# Patient Record
Sex: Female | Born: 1975 | Race: White | Hispanic: No | Marital: Married | Smoking: Never smoker
Health system: Southern US, Community
[De-identification: ages and names within clinical notes are randomized; demographics above are authoritative.]

## PROBLEM LIST (undated history)

## (undated) DIAGNOSIS — C9590 Leukemia, unspecified not having achieved remission: Secondary | ICD-10-CM

## (undated) DIAGNOSIS — F419 Anxiety disorder, unspecified: Secondary | ICD-10-CM

## (undated) DIAGNOSIS — B009 Herpesviral infection, unspecified: Secondary | ICD-10-CM

## (undated) HISTORY — PX: MOUTH SURGERY: SHX715

---
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
## 2000-11-05 ENCOUNTER — Inpatient Hospital Stay (HOSPITAL_COMMUNITY): Admission: AD | Admit: 2000-11-05 | Discharge: 2000-11-05 | Payer: Self-pay | Admitting: *Deleted

## 2001-07-17 ENCOUNTER — Other Ambulatory Visit: Admission: RE | Admit: 2001-07-17 | Discharge: 2001-07-17 | Payer: Self-pay | Admitting: Gynecology

## 2002-08-06 ENCOUNTER — Other Ambulatory Visit: Admission: RE | Admit: 2002-08-06 | Discharge: 2002-08-06 | Payer: Self-pay | Admitting: Gynecology

## 2003-11-05 ENCOUNTER — Other Ambulatory Visit: Admission: RE | Admit: 2003-11-05 | Discharge: 2003-11-05 | Payer: Self-pay | Admitting: Gynecology

## 2005-02-16 ENCOUNTER — Other Ambulatory Visit: Admission: RE | Admit: 2005-02-16 | Discharge: 2005-02-16 | Payer: Self-pay | Admitting: Gynecology

## 2006-02-18 ENCOUNTER — Other Ambulatory Visit: Admission: RE | Admit: 2006-02-18 | Discharge: 2006-02-18 | Payer: Self-pay | Admitting: Gynecology

## 2009-05-27 ENCOUNTER — Ambulatory Visit (HOSPITAL_COMMUNITY): Admission: RE | Admit: 2009-05-27 | Discharge: 2009-05-27 | Payer: Self-pay | Admitting: Obstetrics and Gynecology

## 2010-02-22 ENCOUNTER — Inpatient Hospital Stay (HOSPITAL_COMMUNITY): Admission: AD | Admit: 2010-02-22 | Discharge: 2010-02-25 | Payer: Self-pay | Admitting: Obstetrics and Gynecology

## 2010-04-01 ENCOUNTER — Ambulatory Visit (HOSPITAL_COMMUNITY)
Admission: RE | Admit: 2010-04-01 | Discharge: 2010-04-01 | Payer: Self-pay | Source: Home / Self Care | Admitting: Obstetrics and Gynecology

## 2010-04-07 ENCOUNTER — Encounter: Payer: Self-pay | Admitting: Cardiovascular Disease

## 2010-04-10 DIAGNOSIS — C9590 Leukemia, unspecified not having achieved remission: Secondary | ICD-10-CM | POA: Insufficient documentation

## 2010-04-10 DIAGNOSIS — R079 Chest pain, unspecified: Secondary | ICD-10-CM | POA: Insufficient documentation

## 2010-04-13 ENCOUNTER — Ambulatory Visit: Payer: Self-pay | Admitting: Cardiovascular Disease

## 2010-04-24 ENCOUNTER — Ambulatory Visit (HOSPITAL_COMMUNITY): Admission: RE | Admit: 2010-04-24 | Discharge: 2010-04-24 | Payer: Self-pay | Admitting: Cardiovascular Disease

## 2010-04-24 ENCOUNTER — Encounter: Payer: Self-pay | Admitting: Cardiovascular Disease

## 2010-04-24 ENCOUNTER — Ambulatory Visit: Payer: Self-pay

## 2010-04-24 ENCOUNTER — Ambulatory Visit: Payer: Self-pay | Admitting: Cardiology

## 2010-04-29 ENCOUNTER — Telehealth: Payer: Self-pay | Admitting: Cardiovascular Disease

## 2010-05-12 ENCOUNTER — Ambulatory Visit: Payer: Self-pay

## 2010-05-12 ENCOUNTER — Ambulatory Visit: Payer: Self-pay | Admitting: Cardiovascular Disease

## 2010-06-10 ENCOUNTER — Emergency Department (HOSPITAL_BASED_OUTPATIENT_CLINIC_OR_DEPARTMENT_OTHER): Admission: EM | Admit: 2010-06-10 | Discharge: 2010-06-10 | Payer: Self-pay | Admitting: Emergency Medicine

## 2010-12-01 NOTE — Assessment & Plan Note (Signed)
Summary: np6/chest pain/hx of leukemia/ post partum/jml   Visit Type:  new pt visit Referring Provider:  Dr. Billy Coast Primary Provider:  Olivia Mackie, MD (OB-Gyn)  CC:  pt states she just had a baby 7 weeks ago...cp possible question of anxiety/panic attack...pt also had leukemia.  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 She describes chest pain in the past with anxiety attacks. The pain is center of her chest and occasionally under her breasts. This is a dull pain and it radiates through to the back and occasionally into the left arm. The pain lasts for several minutes up to five time per day. Sometimes the pain lasts for hours. She does get SOB when she has a panic attack. She denies episodes of palpitations although she can feel it racing when she is having a panic attack. She exercises regularly and has no exertional chest pain. She is 7 weeks post-partum. Her son is healthy. She did have HTN during pregnancy but no complications with delivery.  Gyn Current Medications (verified): 1)  Zoloft 100 Mg Tabs (Sertraline Hcl) .Marland Kitchen.. 1  1/2 Tab Once Daily 2)  Klonopin 0.5 Mg Tabs (Clonazepam) .Marland Kitchen.. 1 Tab Two Times A Day 3)  Prenatal Vitamins 0.8 Mg Tabs (Prenatal Multivit-Min-Fe-Fa) .Marland Kitchen.. 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  The patient denies fatigue, malaise, fever, weight gain/loss, vision  loss, decreased hearing, hoarseness, palpitations, shortness of breath, prolonged cough, wheezing, sleep apnea, coughing up blood, abdominal pain, blood in stool, nausea, vomiting, diarrhea, heartburn, incontinence, blood in urine, muscle weakness, joint pain, leg swelling, rash, skin lesions, headache, fainting, dizziness, depression, anxiety, enlarged lymph nodes, easy bruising or bleeding, and environmental allergies.    Vital Signs:  Patient profile:   35 year old female Height:      67 inches Weight:      152 pounds BMI:     23.89 Pulse rate:   66 / minute Pulse rhythm:   regular BP sitting:   100 / 70  (left arm) Cuff size:   regular  Vitals Entered By: Danielle Rankin, CMA (April 13, 2010 3:13 PM)  Physical Exam  General:  General: Well developed, well nourished, NAD HEENT: OP clear, mucus membranes moist SKIN: warm, dry Neuro: No focal deficits Musculoskeletal: Muscle strength 5/5 all ext Psychiatric: Mood and affect normal Neck: No JVD, no carotid bruits, no thyromegaly, no lymphadenopathy. Lungs:Clear bilaterally, no wheezes, rhonci, crackles CV: RRR no murmurs, gallops rubs Abdomen: soft, NT, ND, BS present Extremities: No edema, pulses 2+.    EKG  Procedure date:  04/13/2010  Findings:      NSR, rate 66 bpm.   Impression & Recommendations:  Problem # 1:  CHEST PAIN (ICD-786.50) Most likely non-cardiac but given her history of chemotherapy and family history of CAD, will arrange exercise stress test to assess for ischemia. I will also arrange an echocardiogram to rule out structural heart disease. These episodes are associated with  anxiety. Will see aortic root on the echo. Her chest pain does not sound like an aortic dissection. Has been occurring on/off for many years. No signs of pulmonary embolus.   Orders: EKG w/ Interpretation (93000) Echocardiogram (Echo) Treadmill (Treadmill)  Patient Instructions: 1)  Your physician recommends that you schedule a  follow-up appointment in: 3 weeks 2)  Your physician has requested that you have an echocardiogram.  Echocardiography is a painless test that uses sound waves to create images of your heart. It provides your doctor with information about the size and shape of your heart and how well your heart's chambers and valves are working.  This procedure takes approximately one hour. There are no restrictions for this procedure. 3)  Your physician has requested that you have an exercise tolerance test.  For further information please visit [email or web address]  Please also follow instruction sheet, as given.

## 2010-12-01 NOTE — Progress Notes (Signed)
Summary: returning call  Phone Note Call from Patient Call back at Home Phone [phone]   Caller: Patient Reason for Call: Talk to Nurse Summary of Call: returning call Initial call taken by: Migdalia Dk,  April 29, 2010 12:00 PM  Follow-up for Phone Call        left message to  call back Dossie Arbour, RN, BSN  April 29, 2010 1:14 PM' I had called pt regarding echo results.  Dr. Clifton James reviewed echo with pt at treadmill office visit on May 12, 2010 Follow-up by: Dossie Arbour, RN, BSN,  May 12, 2010 5:49 PM

## 2010-12-01 NOTE — Letter (Signed)
Summary: [facility] Office Visit Note   [facility] Office Visit Note   Imported By: Roderic Ovens 04/30/2010 10:14:29  _____________________________________________________________________  External Attachment:    Type:   Image     Comment:   External Document

## 2010-12-01 NOTE — Consult Note (Signed)
Summary: [facility] Patient Referral Form   [facility] Patient Referral Form   Imported By: Roderic Ovens 04/30/2010 10:15:31  _____________________________________________________________________  External Attachment:    Type:   Image     Comment:   External Document

## 2011-01-15 LAB — PREGNANCY, URINE: Preg Test, Ur: NEGATIVE

## 2011-01-19 LAB — COMPREHENSIVE METABOLIC PANEL
ALT: 19 U/L (ref 0–35)
ALT: 19 U/L (ref 0–35)
AST: 24 U/L (ref 0–37)
AST: 28 U/L (ref 0–37)
Albumin: 2.7 g/dL — ABNORMAL LOW (ref 3.5–5.2)
Albumin: 2.9 g/dL — ABNORMAL LOW (ref 3.5–5.2)
Alkaline Phosphatase: 111 U/L (ref 39–117)
Alkaline Phosphatase: 124 U/L — ABNORMAL HIGH (ref 39–117)
BUN: 12 mg/dL (ref 6–23)
BUN: 14 mg/dL (ref 6–23)
CO2: 22 mEq/L (ref 19–32)
CO2: 23 mEq/L (ref 19–32)
Calcium: 8.2 mg/dL — ABNORMAL LOW (ref 8.4–10.5)
Calcium: 9.3 mg/dL (ref 8.4–10.5)
Chloride: 105 mEq/L (ref 96–112)
Chloride: 99 mEq/L (ref 96–112)
Creatinine, Ser: 0.58 mg/dL (ref 0.4–1.2)
Creatinine, Ser: 0.66 mg/dL (ref 0.4–1.2)
GFR calc Af Amer: >60 mL/min (ref >60)
GFR calc Af Amer: >60 mL/min (ref >60)
GFR calc non Af Amer: >60 mL/min (ref >60)
GFR calc non Af Amer: >60 mL/min (ref >60)
Glucose, Bld: 78 mg/dL (ref 70–99)
Glucose, Bld: 92 mg/dL (ref 70–99)
Potassium: 3.9 mEq/L (ref 3.5–5.1)
Potassium: 3.9 mEq/L (ref 3.5–5.1)
Sodium: 129 mEq/L — ABNORMAL LOW (ref 135–145)
Sodium: 135 mEq/L (ref 135–145)
Total Bilirubin: 0.2 mg/dL — ABNORMAL LOW (ref 0.3–1.2)
Total Bilirubin: 0.6 mg/dL (ref 0.3–1.2)
Total Protein: 5.5 g/dL — ABNORMAL LOW (ref 6.0–8.3)
Total Protein: 5.7 g/dL — ABNORMAL LOW (ref 6.0–8.3)

## 2011-01-19 LAB — CBC
HCT: 33.8 % — ABNORMAL LOW (ref 36.0–46.0)
HCT: 34.5 % — ABNORMAL LOW (ref 36.0–46.0)
HCT: 34.8 % — ABNORMAL LOW (ref 36.0–46.0)
HCT: 35 % — ABNORMAL LOW (ref 36.0–46.0)
Hemoglobin: 11.6 g/dL — ABNORMAL LOW (ref 12.0–15.0)
Hemoglobin: 11.8 g/dL — ABNORMAL LOW (ref 12.0–15.0)
Hemoglobin: 11.8 g/dL — ABNORMAL LOW (ref 12.0–15.0)
Hemoglobin: 11.9 g/dL — ABNORMAL LOW (ref 12.0–15.0)
MCHC: 33.7 g/dL (ref 30.0–36.0)
MCHC: 34.2 g/dL (ref 30.0–36.0)
MCHC: 34.3 g/dL (ref 30.0–36.0)
MCHC: 34.4 g/dL (ref 30.0–36.0)
MCV: 95.1 fL (ref 78.0–100.0)
MCV: 95.2 fL (ref 78.0–100.0)
MCV: 95.2 fL (ref 78.0–100.0)
MCV: 95.6 fL (ref 78.0–100.0)
Platelets: 163 10*3/uL (ref 150–400)
Platelets: 163 10*3/uL (ref 150–400)
Platelets: 176 10*3/uL (ref 150–400)
Platelets: 187 10*3/uL (ref 150–400)
RBC: 3.55 MIL/uL — ABNORMAL LOW (ref 3.87–5.11)
RBC: 3.62 MIL/uL — ABNORMAL LOW (ref 3.87–5.11)
RBC: 3.66 MIL/uL — ABNORMAL LOW (ref 3.87–5.11)
RBC: 3.66 MIL/uL — ABNORMAL LOW (ref 3.87–5.11)
RDW: 13.5 % (ref 11.5–15.5)
RDW: 13.8 % (ref 11.5–15.5)
RDW: 13.9 % (ref 11.5–15.5)
RDW: 14 % (ref 11.5–15.5)
WBC: 13.4 10*3/uL — ABNORMAL HIGH (ref 4.0–10.5)
WBC: 13.8 10*3/uL — ABNORMAL HIGH (ref 4.0–10.5)
WBC: 18 10*3/uL — ABNORMAL HIGH (ref 4.0–10.5)
WBC: 9.4 10*3/uL (ref 4.0–10.5)

## 2011-01-19 LAB — URIC ACID
Uric Acid, Serum: 4.3 mg/dL (ref 2.4–7.0)
Uric Acid, Serum: 4.6 mg/dL (ref 2.4–7.0)

## 2011-01-19 LAB — LACTATE DEHYDROGENASE
LDH: 144 U/L (ref 94–250)
LDH: 177 U/L (ref 94–250)

## 2011-01-19 LAB — RPR: RPR Ser Ql: NONREACTIVE

## 2012-06-21 ENCOUNTER — Other Ambulatory Visit: Payer: Self-pay | Admitting: Family Medicine

## 2012-06-21 DIAGNOSIS — R200 Anesthesia of skin: Secondary | ICD-10-CM

## 2012-06-21 DIAGNOSIS — R51 Headache: Secondary | ICD-10-CM

## 2012-06-21 DIAGNOSIS — R202 Paresthesia of skin: Secondary | ICD-10-CM

## 2012-06-22 ENCOUNTER — Ambulatory Visit
Admission: RE | Admit: 2012-06-22 | Discharge: 2012-06-22 | Disposition: A | Discharge status: Home / Self Care | Payer: BC Managed Care – PPO | Source: Ambulatory Visit | Attending: Family Medicine | Admitting: Family Medicine

## 2012-06-22 DIAGNOSIS — R200 Anesthesia of skin: Secondary | ICD-10-CM

## 2012-06-22 DIAGNOSIS — R51 Headache: Secondary | ICD-10-CM

## 2012-06-22 DIAGNOSIS — R202 Paresthesia of skin: Secondary | ICD-10-CM

## 2012-06-22 MED ORDER — IOHEXOL 300 MG/ML  SOLN
75.0000 mL | Freq: Once | INTRAMUSCULAR | Status: AC | PRN
  Administered 2012-06-22: 75 mL via INTRAVENOUS

## 2012-09-19 ENCOUNTER — Other Ambulatory Visit: Payer: Self-pay | Admitting: Family Medicine

## 2012-09-19 ENCOUNTER — Ambulatory Visit
Admission: RE | Admit: 2012-09-19 | Discharge: 2012-09-19 | Disposition: A | Discharge status: Home / Self Care | Payer: BC Managed Care – PPO | Source: Ambulatory Visit | Attending: Family Medicine | Admitting: Family Medicine

## 2012-09-19 DIAGNOSIS — R109 Unspecified abdominal pain: Secondary | ICD-10-CM

## 2012-09-20 LAB — OB RESULTS CONSOLE GC/CHLAMYDIA
Chlamydia: NEGATIVE
Gonorrhea: NEGATIVE

## 2012-09-29 LAB — OB RESULTS CONSOLE HIV ANTIBODY (ROUTINE TESTING)
HIV: NONREACTIVE
HIV: NONREACTIVE
HIV: NONREACTIVE
HIV: NONREACTIVE

## 2012-09-29 LAB — OB RESULTS CONSOLE GC/CHLAMYDIA
Chlamydia: NEGATIVE
Chlamydia: NEGATIVE
Gonorrhea: NEGATIVE
Gonorrhea: NEGATIVE

## 2012-09-29 LAB — OB RESULTS CONSOLE ABO/RH: RH Type: POSITIVE

## 2012-09-29 LAB — OB RESULTS CONSOLE RPR
RPR: NONREACTIVE
RPR: NONREACTIVE

## 2012-09-29 LAB — OB RESULTS CONSOLE HEPATITIS B SURFACE ANTIGEN: Hepatitis B Surface Ag: NEGATIVE

## 2012-09-29 LAB — OB RESULTS CONSOLE RUBELLA ANTIBODY, IGM: Rubella: IMMUNE

## 2012-09-29 LAB — OB RESULTS CONSOLE ANTIBODY SCREEN: Antibody Screen: NEGATIVE

## 2012-11-01 NOTE — L&D Delivery Note (Signed)
Delivery Note At 6:38 PM a viable and healthy female was delivered via Vaginal, Spontaneous Delivery (Presentation: Right Occiput Anterior).  APGAR: 8, 9; weight pending. Placenta status: Intact, Spontaneous.  Cord: 3 vessels with the following complications: None.  Cord pH: na  Anesthesia: Epidural  Episiotomy: None Lacerations: 2nd degree Suture Repair: 2.0 vicryl rapide Est. Blood Loss (mL): 200  Mom to postpartum.  Baby to nursery-stable.  Nikash Mortensen J 04/11/2013, 6:56 PM

## 2013-03-08 LAB — OB RESULTS CONSOLE GBS
GBS: NEGATIVE
GBS: NEGATIVE

## 2013-03-09 ENCOUNTER — Inpatient Hospital Stay (HOSPITAL_COMMUNITY): Admit: 2013-03-09 | Payer: BC Managed Care – PPO | Admitting: Obstetrics and Gynecology

## 2013-03-28 LAB — OB RESULTS CONSOLE GBS: GBS: NEGATIVE

## 2013-04-10 ENCOUNTER — Inpatient Hospital Stay (HOSPITAL_COMMUNITY)
Admission: RE | Admit: 2013-04-10 | Discharge: 2013-04-13 | DRG: 372 | Disposition: A | Discharge status: Home / Self Care | Payer: BC Managed Care – PPO | Source: Ambulatory Visit | Attending: Obstetrics & Gynecology | Admitting: Obstetrics & Gynecology

## 2013-04-10 ENCOUNTER — Other Ambulatory Visit: Payer: Self-pay | Admitting: Obstetrics and Gynecology

## 2013-04-10 ENCOUNTER — Encounter (HOSPITAL_COMMUNITY): Payer: Self-pay

## 2013-04-10 DIAGNOSIS — O9903 Anemia complicating the puerperium: Secondary | ICD-10-CM | POA: Diagnosis not present

## 2013-04-10 DIAGNOSIS — O09529 Supervision of elderly multigravida, unspecified trimester: Secondary | ICD-10-CM | POA: Diagnosis present

## 2013-04-10 DIAGNOSIS — O139 Gestational [pregnancy-induced] hypertension without significant proteinuria, unspecified trimester: Principal | ICD-10-CM | POA: Diagnosis present

## 2013-04-10 DIAGNOSIS — D62 Acute posthemorrhagic anemia: Secondary | ICD-10-CM | POA: Diagnosis not present

## 2013-04-10 HISTORY — DX: Herpesviral infection, unspecified: B00.9

## 2013-04-10 HISTORY — DX: Leukemia, unspecified not having achieved remission: C95.90

## 2013-04-10 HISTORY — DX: Anxiety disorder, unspecified: F41.9

## 2013-04-10 LAB — CBC
HCT: 34 % — ABNORMAL LOW (ref 36.0–46.0)
Hemoglobin: 11.5 g/dL — ABNORMAL LOW (ref 12.0–15.0)
MCH: 31.8 pg (ref 26.0–34.0)
MCHC: 33.8 g/dL (ref 30.0–36.0)
MCV: 93.9 fL (ref 78.0–100.0)
Platelets: 165 10*3/uL (ref 150–400)
RBC: 3.62 MIL/uL — ABNORMAL LOW (ref 3.87–5.11)
RDW: 13.5 % (ref 11.5–15.5)
WBC: 9.1 10*3/uL (ref 4.0–10.5)

## 2013-04-10 MED ORDER — LACTATED RINGERS IV SOLN
INTRAVENOUS | Status: DC
  Administered 2013-04-10 – 2013-04-11 (×3): via INTRAVENOUS

## 2013-04-10 MED ORDER — FLEET ENEMA 7-19 GM/118ML RE ENEM: 1.0000 | ENEMA | RECTAL | Status: DC | PRN

## 2013-04-10 MED ORDER — CITRIC ACID-SODIUM CITRATE 334-500 MG/5ML PO SOLN
30.0000 mL | ORAL | Status: DC | PRN
  Filled 2013-04-10: qty 15

## 2013-04-10 MED ORDER — OXYTOCIN 40 UNITS IN LACTATED RINGERS INFUSION - SIMPLE MED
1.0000 m[IU]/min | INTRAVENOUS | Status: DC
  Administered 2013-04-11: 2 m[IU]/min via INTRAVENOUS
  Filled 2013-04-10: qty 1000

## 2013-04-10 MED ORDER — OXYTOCIN 40 UNITS IN LACTATED RINGERS INFUSION - SIMPLE MED: 62.5000 mL/h | INTRAVENOUS | Status: DC

## 2013-04-10 MED ORDER — OXYCODONE-ACETAMINOPHEN 5-325 MG PO TABS: 1.0000 | ORAL_TABLET | ORAL | Status: DC | PRN

## 2013-04-10 MED ORDER — LACTATED RINGERS IV SOLN: 500.0000 mL | INTRAVENOUS | Status: DC | PRN

## 2013-04-10 MED ORDER — BUTORPHANOL TARTRATE 1 MG/ML IJ SOLN: 1.0000 mg | INTRAMUSCULAR | Status: DC | PRN

## 2013-04-10 MED ORDER — IBUPROFEN 600 MG PO TABS: 600.0000 mg | ORAL_TABLET | Freq: Four times a day (QID) | ORAL | Status: DC | PRN

## 2013-04-10 MED ORDER — ONDANSETRON HCL 4 MG/2ML IJ SOLN: 4.0000 mg | Freq: Four times a day (QID) | INTRAMUSCULAR | Status: DC | PRN

## 2013-04-10 MED ORDER — ZOLPIDEM TARTRATE 5 MG PO TABS: 5.0000 mg | ORAL_TABLET | Freq: Every evening | ORAL | Status: DC | PRN

## 2013-04-10 MED ORDER — TERBUTALINE SULFATE 1 MG/ML IJ SOLN
0.2500 mg | Freq: Once | INTRAMUSCULAR | Status: AC | PRN
  Filled 2013-04-10: qty 1

## 2013-04-10 MED ORDER — MISOPROSTOL 25 MCG QUARTER TABLET
25.0000 ug | ORAL_TABLET | ORAL | Status: DC | PRN
  Administered 2013-04-10: 25 ug via VAGINAL
  Filled 2013-04-10: qty 0.25
  Filled 2013-04-10: qty 1
  Filled 2013-04-10: qty 0.25

## 2013-04-10 MED ORDER — OXYTOCIN BOLUS FROM INFUSION: 500.0000 mL | INTRAVENOUS | Status: DC

## 2013-04-10 MED ORDER — LIDOCAINE HCL (PF) 1 % IJ SOLN
30.0000 mL | INTRAMUSCULAR | Status: DC | PRN
  Filled 2013-04-10: qty 30

## 2013-04-10 MED ORDER — ACETAMINOPHEN 325 MG PO TABS: 650.0000 mg | ORAL_TABLET | ORAL | Status: DC | PRN

## 2013-04-10 NOTE — Progress Notes (Signed)
Autumn Fuller is a 37 y.o. G3P0011 at [redacted]w[redacted]d by LMP admitted for induction of labor due to Hypertension.  Subjective: Comfortable. No headache or epigastric pain  Objective: BP 148/88  Pulse 86  Temp(Src) 98.6 F (37 C) (Oral)  Resp 18  Ht 5\' 7"  (1.702 m)  Wt 75.751 kg (167 lb)  BMI 26.15 kg/m2  LMP 07/06/2012      FHT:  FHR: 125 bpm, variability: moderate,  accelerations:  Present,  decelerations:  Absent UC:   irregular, every 10 minutes SVE:   Dilation: 1 Effacement (%): 30 Station: -3 Exam by:: L. Dupell, RN  Labs: Lab Results  Component Value Date   WBC 9.1 04/10/2013   HGB 11.5* 04/10/2013   HCT 34.0* 04/10/2013   MCV 93.9 04/10/2013   PLT 165 04/10/2013    Assessment / Plan: 39+ weeks Gestational HTN- no s/s PEC  Labor: Progressing normally- cytotec placed Preeclampsia:  no signs or symptoms of toxicity, intake and ouput balanced and labs stable Fetal Wellbeing:  Category I Pain Control:  Labor support without medications I/D:  n/a Anticipated MOD:  NSVD  Autumn Fuller 04/10/2013, 10:59 PM

## 2013-04-11 ENCOUNTER — Inpatient Hospital Stay (HOSPITAL_COMMUNITY): Payer: BC Managed Care – PPO

## 2013-04-11 ENCOUNTER — Encounter (HOSPITAL_COMMUNITY): Payer: Self-pay | Admitting: Anesthesiology

## 2013-04-11 ENCOUNTER — Encounter (HOSPITAL_COMMUNITY): Payer: Self-pay

## 2013-04-11 ENCOUNTER — Inpatient Hospital Stay (HOSPITAL_COMMUNITY): Payer: BC Managed Care – PPO | Admitting: Anesthesiology

## 2013-04-11 DIAGNOSIS — O139 Gestational [pregnancy-induced] hypertension without significant proteinuria, unspecified trimester: Secondary | ICD-10-CM | POA: Diagnosis present

## 2013-04-11 LAB — CBC
HCT: 35.2 % — ABNORMAL LOW (ref 36.0–46.0)
HCT: 30.8 % — ABNORMAL LOW (ref 36.0–46.0)
Hemoglobin: 11.9 g/dL — ABNORMAL LOW (ref 12.0–15.0)
Hemoglobin: 10.3 g/dL — ABNORMAL LOW (ref 12.0–15.0)
MCH: 31.6 pg (ref 26.0–34.0)
MCH: 31.2 pg (ref 26.0–34.0)
MCHC: 33.8 g/dL (ref 30.0–36.0)
MCHC: 33.4 g/dL (ref 30.0–36.0)
MCV: 93.4 fL (ref 78.0–100.0)
MCV: 93.3 fL (ref 78.0–100.0)
Platelets: 164 10*3/uL (ref 150–400)
Platelets: 147 10*3/uL — ABNORMAL LOW (ref 150–400)
RBC: 3.77 MIL/uL — ABNORMAL LOW (ref 3.87–5.11)
RBC: 3.3 MIL/uL — ABNORMAL LOW (ref 3.87–5.11)
RDW: 13.9 % (ref 11.5–15.5)
RDW: 13.9 % (ref 11.5–15.5)
WBC: 10.7 10*3/uL — ABNORMAL HIGH (ref 4.0–10.5)
WBC: 13.5 10*3/uL — ABNORMAL HIGH (ref 4.0–10.5)

## 2013-04-11 LAB — RPR: RPR Ser Ql: NONREACTIVE

## 2013-04-11 MED ORDER — IBUPROFEN 600 MG PO TABS
600.0000 mg | ORAL_TABLET | Freq: Four times a day (QID) | ORAL | Status: DC
  Administered 2013-04-11 – 2013-04-13 (×6): 600 mg via ORAL
  Filled 2013-04-11 (×6): qty 1

## 2013-04-11 MED ORDER — BENZOCAINE-MENTHOL 20-0.5 % EX AERO
1.0000 "application " | INHALATION_SPRAY | CUTANEOUS | Status: DC | PRN
  Administered 2013-04-12: 1 via TOPICAL
  Filled 2013-04-11: qty 56

## 2013-04-11 MED ORDER — DIPHENHYDRAMINE HCL 25 MG PO CAPS: 25.0000 mg | ORAL_CAPSULE | Freq: Four times a day (QID) | ORAL | Status: DC | PRN

## 2013-04-11 MED ORDER — DIBUCAINE 1 % RE OINT: 1.0000 "application " | TOPICAL_OINTMENT | RECTAL | Status: DC | PRN

## 2013-04-11 MED ORDER — LACTATED RINGERS IV SOLN: 500.0000 mL | Freq: Once | INTRAVENOUS | Status: DC

## 2013-04-11 MED ORDER — PRENATAL MULTIVITAMIN CH: 1.0000 | ORAL_TABLET | Freq: Every day | ORAL | Status: DC

## 2013-04-11 MED ORDER — LANOLIN HYDROUS EX OINT: TOPICAL_OINTMENT | CUTANEOUS | Status: DC | PRN

## 2013-04-11 MED ORDER — EPHEDRINE 5 MG/ML INJ
10.0000 mg | INTRAVENOUS | Status: DC | PRN
  Filled 2013-04-11: qty 2

## 2013-04-11 MED ORDER — EPHEDRINE 5 MG/ML INJ
10.0000 mg | INTRAVENOUS | Status: DC | PRN
  Filled 2013-04-11: qty 4
  Filled 2013-04-11: qty 2

## 2013-04-11 MED ORDER — TETANUS-DIPHTH-ACELL PERTUSSIS 5-2.5-18.5 LF-MCG/0.5 IM SUSP: 0.5000 mL | Freq: Once | INTRAMUSCULAR | Status: DC

## 2013-04-11 MED ORDER — SERTRALINE HCL 100 MG PO TABS
100.0000 mg | ORAL_TABLET | Freq: Every day | ORAL | Status: DC
  Administered 2013-04-12 – 2013-04-13 (×2): 100 mg via ORAL
  Filled 2013-04-11 (×2): qty 1

## 2013-04-11 MED ORDER — ONDANSETRON HCL 4 MG PO TABS: 4.0000 mg | ORAL_TABLET | ORAL | Status: DC | PRN

## 2013-04-11 MED ORDER — ZOLPIDEM TARTRATE 5 MG PO TABS: 5.0000 mg | ORAL_TABLET | Freq: Every evening | ORAL | Status: DC | PRN

## 2013-04-11 MED ORDER — DIPHENHYDRAMINE HCL 50 MG/ML IJ SOLN: 12.5000 mg | INTRAMUSCULAR | Status: DC | PRN

## 2013-04-11 MED ORDER — ONDANSETRON HCL 4 MG/2ML IJ SOLN: 4.0000 mg | INTRAMUSCULAR | Status: DC | PRN

## 2013-04-11 MED ORDER — METHYLERGONOVINE MALEATE 0.2 MG/ML IJ SOLN: 0.2000 mg | INTRAMUSCULAR | Status: DC | PRN

## 2013-04-11 MED ORDER — METHYLERGONOVINE MALEATE 0.2 MG PO TABS: 0.2000 mg | ORAL_TABLET | ORAL | Status: DC | PRN

## 2013-04-11 MED ORDER — OXYCODONE-ACETAMINOPHEN 5-325 MG PO TABS: 1.0000 | ORAL_TABLET | ORAL | Status: DC | PRN

## 2013-04-11 MED ORDER — WITCH HAZEL-GLYCERIN EX PADS: 1.0000 "application " | MEDICATED_PAD | CUTANEOUS | Status: DC | PRN

## 2013-04-11 MED ORDER — SENNOSIDES-DOCUSATE SODIUM 8.6-50 MG PO TABS
2.0000 | ORAL_TABLET | Freq: Every day | ORAL | Status: DC
  Administered 2013-04-11 – 2013-04-12 (×2): 2 via ORAL

## 2013-04-11 MED ORDER — SIMETHICONE 80 MG PO CHEW: 80.0000 mg | CHEWABLE_TABLET | ORAL | Status: DC | PRN

## 2013-04-11 MED ORDER — PHENYLEPHRINE 40 MCG/ML (10ML) SYRINGE FOR IV PUSH (FOR BLOOD PRESSURE SUPPORT)
80.0000 ug | PREFILLED_SYRINGE | INTRAVENOUS | Status: DC | PRN
  Filled 2013-04-11: qty 2

## 2013-04-11 MED ORDER — LIDOCAINE HCL (PF) 1 % IJ SOLN
INTRAMUSCULAR | Status: DC | PRN
  Administered 2013-04-11 (×2): 5 mL

## 2013-04-11 MED ORDER — PHENYLEPHRINE 40 MCG/ML (10ML) SYRINGE FOR IV PUSH (FOR BLOOD PRESSURE SUPPORT)
80.0000 ug | PREFILLED_SYRINGE | INTRAVENOUS | Status: DC | PRN
  Filled 2013-04-11: qty 2
  Filled 2013-04-11: qty 5

## 2013-04-11 MED ORDER — FENTANYL 2.5 MCG/ML BUPIVACAINE 1/10 % EPIDURAL INFUSION (WH - ANES)
14.0000 mL/h | INTRAMUSCULAR | Status: DC | PRN
  Administered 2013-04-11: 14 mL/h via EPIDURAL
  Filled 2013-04-11: qty 125

## 2013-04-11 NOTE — Progress Notes (Signed)
Patient sitting up to dangle legs, became pale and weak, near syncopal.  RN helped patient lay back in bed flat.  Color returned to lips and face, patient feeling better.  Transported to MBE 120 via stretcher.

## 2013-04-11 NOTE — Anesthesia Procedure Notes (Signed)
Epidural Patient location during procedure: OB Start time: 04/11/2013 3:04 PM  Staffing Anesthesiologist: Angus Seller., Harrell Gave. Performed by: anesthesiologist   Preanesthetic Checklist Completed: patient identified, site marked, surgical consent, pre-op evaluation, timeout performed, IV checked, risks and benefits discussed and monitors and equipment checked  Epidural Patient position: sitting Prep: site prepped and draped and DuraPrep Patient monitoring: continuous pulse ox and blood pressure Approach: midline Injection technique: LOR air and LOR saline  Needle:  Needle type: Tuohy  Needle gauge: 17 G Needle length: 9 cm and 9 Needle insertion depth: 5 cm cm Catheter type: closed end flexible Catheter size: 19 Gauge Catheter at skin depth: 10 cm Test dose: negative  Assessment Events: blood not aspirated, injection not painful, no injection resistance, negative IV test and no paresthesia  Additional Notes Patient identified.  Risk benefits discussed including failed block, incomplete pain control, headache, nerve damage, paralysis, blood pressure changes, nausea, vomiting, reactions to medication both toxic or allergic, and postpartum back pain.  Patient expressed understanding and wished to proceed.  All questions were answered.  Sterile technique used throughout procedure and epidural site dressed with sterile barrier dressing. No paresthesia or other complications noted.The patient did not experience any signs of intravascular injection such as tinnitus or metallic taste in mouth nor signs of intrathecal spread such as rapid motor block. Please see nursing notes for vital signs.

## 2013-04-11 NOTE — H&P (Cosign Needed)
NAMEILAISAANE, MARTS              ACCOUNT NO.:  [id number]  MEDICAL RECORD NO.:  [id number]  LOCATION:  9161                          FACILITY:  [facility]  PHYSICIAN:  Lenoard Aden, M.D.DATE OF BIRTH:  [birth year]  DATE OF ADMISSION:  04/10/2013 DATE OF DISCHARGE:                             HISTORY & PHYSICAL   CHIEF COMPLAINT:  Gestational hypertension at 65 and 5/7th weeks for induction.  HISTORY OF PRESENT ILLNESS:  She is a 37 year old white female, G3, P1, who presents with gestational hypertension, no signs and symptoms of preeclampsia for induction.  MEDICATIONS:  Include prenatal vitamins, Zoloft, Valtrex, and BuSpar as needed.  FAMILY HISTORY:  Migraine headaches, heart disease, ovarian cancer, and depression.  PREGNANCY HISTORY:  One vaginal delivery in 2011 complicated by gestational hypertension, missed AB with medical termination.  SOCIAL HISTORY:  Remarkable only for HSG.  PERSONAL HISTORY:  Depression, anxiety and HSV.  She is a nonsmoker and nondrinker.  She denies domestic or physical violence physical. Prenatal course was complicated by gestational hypertension with normal labs and history of malpresentation.  PHYSICAL EXAMINATION:  GENERAL:  Well-developed, well-nourished, white female in no acute distress. HEENT:  Normal. NECK:  Supple.  Full range of motion. LUNGS:  Clear. HEART:  Regular rate and rhythm. ABDOMEN:  Soft, gravid, nontender.  No right upper quadrant tenderness. Cervix is 1, 60%, vertex, -3. EXTREMITIES:  There are no cords. NEUROLOGIC:  Nonfocal. SKIN:  Intact.  Labs are pending.  NST is reactive.  Cytotec was placed.  IMPRESSION:  Gestational hypertension at term.  PLAN:  Proceed with Cytotec induction.  Anticipate attempts at vaginal delivery.  Monitor signs and symptoms of preeclampsia.     Lenoard Aden, M.D.     RJT/MEDQ  D:  04/10/2013  T:  04/11/2013  Job:  [id number]

## 2013-04-11 NOTE — Progress Notes (Signed)
Autumn Fuller is a 37 y.o. G3P0011 at [redacted]w[redacted]d by LMP admitted for induction of labor due to Hypertension.  Subjective: Comfortable  Objective: BP 133/83  Pulse 65  Temp(Src) 97.9 F (36.6 C) (Oral)  Resp 18  Ht 5\' 7"  (1.702 m)  Wt 75.751 kg (167 lb)  BMI 26.15 kg/m2  SpO2 99%  LMP 07/06/2012      FHT:  FHR: 130 bpm, variability: moderate,  accelerations:  Present,  decelerations:  Absent UC:   irregular, every 10 minutes SVE:   Dilation: 1.5 Effacement (%): 50 Station: -3 Exam by:: L. Dupell, RN Non vertex lie persists- Frank breech by [employer] ECV consent done Forward roll x one with successful ECV noted. FHR reassuring post ECV AROM performed and pitocin initiated  Labs: Lab Results  Component Value Date   WBC 9.1 04/10/2013   HGB 11.5* 04/10/2013   HCT 34.0* 04/10/2013   MCV 93.9 04/10/2013   PLT 165 04/10/2013    Assessment / Plan: 39+ weeks Gestational HTN Unstable fetal lie now non vertex- successful ECV  Labor: Start Pitocin Preeclampsia:  no signs or symptoms of toxicity, intake and ouput balanced and labs stable Fetal Wellbeing:  Category I Pain Control:  Labor support without medications I/D:  n/a Anticipated MOD:  Pursue SVD  Autumn Fuller J 04/11/2013, 6:56 AM

## 2013-04-11 NOTE — Anesthesia Preprocedure Evaluation (Addendum)
Anesthesia Evaluation  Patient identified by MRN, date of birth, ID band Patient awake    Reviewed: Allergy & Precautions, H&P , Patient's Chart, lab work & pertinent test results  Airway Mallampati: II TM Distance: >3 FB Neck ROM: full    Dental no notable dental hx.    Pulmonary neg pulmonary ROS,  breath sounds clear to auscultation  Pulmonary exam normal       Cardiovascular hypertension, negative cardio ROS  Rhythm:regular Rate:Normal     Neuro/Psych negative neurological ROS  negative psych ROS   GI/Hepatic negative GI ROS, Neg liver ROS,   Endo/Other  negative endocrine ROS  Renal/GU negative Renal ROS     Musculoskeletal   Abdominal   Peds  Hematology negative hematology ROS (+) Blood dyscrasia, ,   Anesthesia Other Findings Leukemia 1981 Remission in 1984 Anxiety        HSV (herpes simplex virus) infection   last outbreak 2003    Reproductive/Obstetrics (+) Pregnancy                          Anesthesia Physical Anesthesia Plan  ASA: III  Anesthesia Plan: Epidural   Post-op Pain Management:    Induction:   Airway Management Planned:   Additional Equipment:   Intra-op Plan:   Post-operative Plan:   Informed Consent: I have reviewed the patients History and Physical, chart, labs and discussed the procedure including the risks, benefits and alternatives for the proposed anesthesia with the patient or authorized representative who has indicated his/her understanding and acceptance.     Plan Discussed with:   Anesthesia Plan Comments:        Anesthesia Quick Evaluation

## 2013-04-11 NOTE — Progress Notes (Signed)
Autumn Fuller is a 37 y.o. G3P0011 at [redacted]w[redacted]d by LMP admitted for induction of labor due to Hypertension.  Subjective: Contraction intensity increasing  Objective: BP 140/88  Pulse 76  Temp(Src) 97.9 F (36.6 C) (Oral)  Resp 18  Ht 5\' 7"  (1.702 m)  Wt 75.751 kg (167 lb)  BMI 26.15 kg/m2  SpO2 99%  LMP 07/06/2012      FHT:  FHR: 125 bpm, variability: moderate,  accelerations:  Present,  decelerations:  Absent UC:   regular, every 2 minutes SVE:   Dilation: 4 Effacement (%): 80 Station: -2 Exam by:: m wilkins rnc IUPC placed without difficulty  Labs: Lab Results  Component Value Date   WBC 9.1 04/10/2013   HGB 11.5* 04/10/2013   HCT 34.0* 04/10/2013   MCV 93.9 04/10/2013   PLT 165 04/10/2013    Assessment / Plan: Induction of labor due to gestational hypertension,  progressing well on pitocin Successful ECV  Labor: Progressing normally Preeclampsia:  no signs or symptoms of toxicity, intake and ouput balanced and labs stable Fetal Wellbeing:  Category I Pain Control:  Labor support without medications I/D:  n/a Anticipated MOD:  NSVD  Autumn Fuller J 04/11/2013, 12:33 PM

## 2013-04-12 ENCOUNTER — Inpatient Hospital Stay (HOSPITAL_COMMUNITY): Admission: AD | Admit: 2013-04-12 | Payer: Self-pay | Source: Ambulatory Visit | Admitting: Obstetrics and Gynecology

## 2013-04-12 ENCOUNTER — Encounter (HOSPITAL_COMMUNITY): Payer: Self-pay

## 2013-04-12 LAB — CBC
HCT: 29.4 % — ABNORMAL LOW (ref 36.0–46.0)
Hemoglobin: 9.9 g/dL — ABNORMAL LOW (ref 12.0–15.0)
MCH: 32.1 pg (ref 26.0–34.0)
MCHC: 33.7 g/dL (ref 30.0–36.0)
MCV: 95.5 fL (ref 78.0–100.0)
Platelets: 148 10*3/uL — ABNORMAL LOW (ref 150–400)
RBC: 3.08 MIL/uL — ABNORMAL LOW (ref 3.87–5.11)
RDW: 13.8 % (ref 11.5–15.5)
WBC: 11.1 10*3/uL — ABNORMAL HIGH (ref 4.0–10.5)

## 2013-04-12 MED ORDER — BUSPIRONE HCL 15 MG PO TABS
15.0000 mg | ORAL_TABLET | Freq: Every morning | ORAL | Status: DC
  Administered 2013-04-13: 15 mg via ORAL
  Filled 2013-04-12: qty 1

## 2013-04-12 MED ORDER — BUSPIRONE HCL 10 MG PO TABS
10.0000 mg | ORAL_TABLET | Freq: Every day | ORAL | Status: DC
  Administered 2013-04-12: 10 mg via ORAL
  Filled 2013-04-12: qty 1

## 2013-04-12 NOTE — Progress Notes (Signed)

## 2013-04-12 NOTE — Lactation Note (Signed)
This note was copied from the chart of Autumn Fuller. Lactation Consultation Note; Initial visit with mom. Baby getting bath and family members visiting. Mom reports that baby nursed well after delivery and a couple of times since but has been very sleepy the last few hours. Reviewed basic teaching- to watch for feeding cues and feed whenever she sees them or to put the baby skin to skin q 3 hours. Mom reports history of low milk supply with first, Reports that she BF for 9 months but supplemented the entire times she was breastfeeding. No questions at present. Encouraged to call for assist prn. BF brochure given with resources for support after DC.    Patient Name: Autumn Keylee Shrestha [id number] Date: 04/12/2013 Reason for consult: Initial assessment   Maternal Data Formula Feeding for Exclusion: No Infant to breast within first hour of birth: Yes Does the patient have breastfeeding experience prior to this delivery?: Yes  Feeding    LATCH Score/Interventions                      Lactation Tools Discussed/Used     Consult Status Consult Status: Follow-up Date: 04/13/13 Follow-up type: In-patient    Pamelia Hoit 04/12/2013, 11:02 AM

## 2013-04-12 NOTE — Anesthesia Postprocedure Evaluation (Signed)
  Anesthesia Post-op Note  Patient: Autumn Fuller  Procedure(s) Performed: * No procedures listed *  Patient Location: Mother/Baby  Anesthesia Type:Epidural  Level of Consciousness: awake  Airway and Oxygen Therapy: Patient Spontanous Breathing  Post-op Pain: none  Post-op Assessment: Patient's Cardiovascular Status Stable, Respiratory Function Stable, Patent Airway, No signs of Nausea or vomiting, Adequate PO intake, Pain level controlled, No headache, No backache, No residual numbness and No residual motor weakness  Post-op Vital Signs: Reviewed and stable  Complications: No apparent anesthesia complications

## 2013-04-12 NOTE — Progress Notes (Signed)
Patient ID: Autumn Fuller, female   DOB: 07/17/1976, 37 y.o.   MRN: [id number] PPD # 1  Subjective: Pt reports feeling well/ Pain controlled with ibuprofen and rare percocet Tolerating po/ Voiding without problems/ No n/v Bleeding is moderate Newborn info:  Information for the patient's newborn:  Autumn, Fuller [[id number]]  female  / circ completed this am per Dr Billy Coast Feeding: breast   Objective:  VS: Blood pressure 128/82, pulse 62, temperature 97.9 F (36.6 C), temperature source Oral, resp. rate 18.    Recent Labs  04/11/13 2040 04/12/13 0635  WBC 13.5* 11.1*  HGB 10.3* 9.9*  HCT 30.8* 29.4*  PLT 147* 148*    Blood type: B/Positive/-- (11/29 0000) Rubella: Immune (11/29 0000)    Physical Exam:  General: A & O x 3  alert, cooperative and no distress CV: Regular rate and rhythm Resp: clear Abdomen: soft, nontender, normal bowel sounds Uterine Fundus: firm, below umbilicus, nontender Perineum: healing with good reapproximation Lochia: moderate Ext: edema trace and Homans sign is negative, no sign of DVT   A/P: PPD # 1/ G3 P2 0 1 2/ S/P: SVD w/ 2nd deg lac Mild ABL Anemia; fe supplement at d/c Doing well Continue routine post partum orders Anticipate D/C home in AM    Demetrius Revel, MSN, [facility] 04/12/2013, 1:17 PM

## 2013-04-13 MED ORDER — POLYSACCHARIDE IRON COMPLEX 150 MG PO CAPS: 150.0000 mg | ORAL_CAPSULE | Freq: Every day | ORAL | Status: DC

## 2013-04-13 MED ORDER — OXYCODONE-ACETAMINOPHEN 5-325 MG PO TABS: 1.0000 | ORAL_TABLET | ORAL | Status: DC | PRN

## 2013-04-13 MED ORDER — IBUPROFEN 600 MG PO TABS: 600.0000 mg | ORAL_TABLET | Freq: Four times a day (QID) | ORAL | Status: DC

## 2013-04-13 MED ORDER — POLYSACCHARIDE IRON COMPLEX 150 MG PO CAPS
150.0000 mg | ORAL_CAPSULE | Freq: Every day | ORAL | Status: DC
  Administered 2013-04-13: 150 mg via ORAL
  Filled 2013-04-13: qty 1

## 2013-04-13 NOTE — Progress Notes (Signed)
PPD 2 SVD  S:  Reports feeling well - some backache from pushing             Tolerating po/ No nausea or vomiting             Bleeding is light             Pain controlled with motrin and percocet             Up ad lib / ambulatory / voiding QS  Newborn breast feeding  / Circumcision done O:               VS: BP 122/69  Pulse 56  Temp(Src) 97.3 F (36.3 C) (Oral)  Resp 18  Ht 5\' 7"  (1.702 m)  Wt 75.751 kg (167 lb)  BMI 26.15 kg/m2  SpO2 98%  LMP 07/06/2012   LABS:  Recent Labs  04/11/13 2040 04/12/13 0635  WBC 13.5* 11.1*  HGB 10.3* 9.9*  PLT 147* 148*                                           Physical Exam:             Alert and oriented X3  Lungs: Clear and unlabored  Heart: regular rate and rhythm / no mumurs  Abdomen: soft, non-tender, non-distended              Fundus: firm, non-tender, U-1  Perineum: mild edema  Lochia: light  Extremities: no edema, no calf pain or tenderness    A: PPD # 2             IDA of pregnancy - delivered  Doing well - stable status  P:  Routine post partum orders  DC home  Marlinda Mike CNM, MSN, [facility] 04/13/2013, 9:20 AM

## 2013-04-13 NOTE — Discharge Summary (Signed)
Obstetric Discharge Summary  Reason for Admission: induction of labor Prenatal Procedures: NST and ultrasound Intrapartum Procedures: spontaneous vaginal delivery and ECV - unstable lie from transverse to cephalic Postpartum Procedures: none Complications-Operative and Postpartum: 2nd degree perineal laceration Hemoglobin  Date Value Range Status  04/12/2013 9.9* 12.0 - 15.0 g/dL Final     HCT  Date Value Range Status  04/12/2013 29.4* 36.0 - 46.0 % Final    Physical Exam:  General: alert, cooperative and no distress Lochia: appropriate Uterine Fundus: firm Incision: healing well DVT Evaluation: No evidence of DVT seen on physical exam.  Discharge Diagnoses: Term Pregnancy-delivered and IDA of pregnancy - delivered  Discharge Information: Date: 04/13/2013 Activity: pelvic rest Diet: routine Medications: PNV, Ibuprofen, Iron, Percocet and buspar and zoloft Condition: stable Instructions: refer to practice specific booklet Discharge to: home Follow-up Information   Follow up with Lenoard Aden, MD. Schedule an appointment as soon as possible for a visit in 6 weeks.   Contact information:   [address] [place] [address] [phone]       Newborn Data: Live born female  Birth Weight: 7 lb 10.8 oz (3480 g) APGAR: 8, 9  Home with mother.  Marlinda Mike 04/13/2013, 9:23 AM

## 2013-04-20 ENCOUNTER — Other Ambulatory Visit: Payer: Self-pay | Admitting: Obstetrics and Gynecology

## 2013-04-20 DIAGNOSIS — R1011 Right upper quadrant pain: Secondary | ICD-10-CM

## 2013-04-23 ENCOUNTER — Ambulatory Visit
Admission: RE | Admit: 2013-04-23 | Discharge: 2013-04-23 | Disposition: A | Discharge status: Home / Self Care | Payer: BC Managed Care – PPO | Source: Ambulatory Visit | Attending: Obstetrics and Gynecology | Admitting: Obstetrics and Gynecology

## 2013-04-23 DIAGNOSIS — R1011 Right upper quadrant pain: Secondary | ICD-10-CM

## 2013-08-20 ENCOUNTER — Other Ambulatory Visit: Payer: Self-pay | Admitting: Family Medicine

## 2013-08-20 DIAGNOSIS — R109 Unspecified abdominal pain: Secondary | ICD-10-CM

## 2013-08-22 ENCOUNTER — Ambulatory Visit
Admission: RE | Admit: 2013-08-22 | Discharge: 2013-08-22 | Disposition: A | Discharge status: Home / Self Care | Payer: BC Managed Care – PPO | Source: Ambulatory Visit | Attending: Family Medicine | Admitting: Family Medicine

## 2013-08-22 DIAGNOSIS — R109 Unspecified abdominal pain: Secondary | ICD-10-CM

## 2013-08-22 MED ORDER — IOHEXOL 300 MG/ML  SOLN
100.0000 mL | Freq: Once | INTRAMUSCULAR | Status: AC | PRN
  Administered 2013-08-22: 100 mL via INTRAVENOUS

## 2014-03-06 IMAGING — US US OB LIMITED
1 series · 14 of 18 positions shown · non-contrast
Comparison: none

CLINICAL DATA: Presentation.  Estimated gestational age by LMP is
39 weeks 6 days.

[Series 1: us ob limited · 14 of 18 slices shown]
[im 1/18]
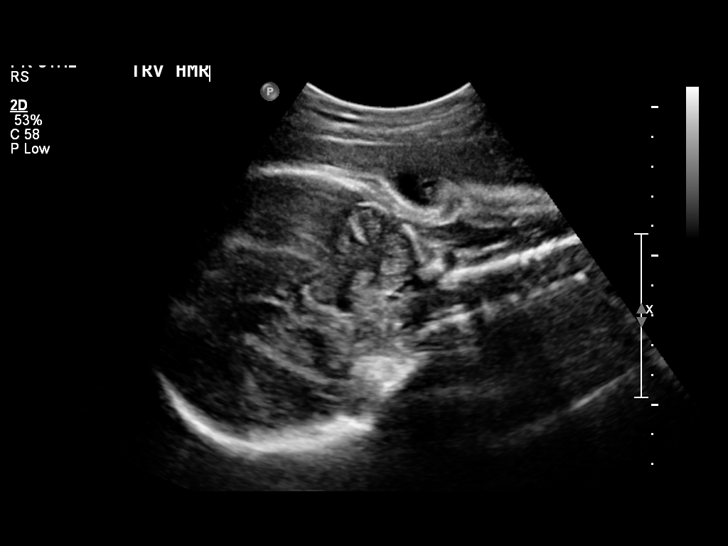
[im 2/18]
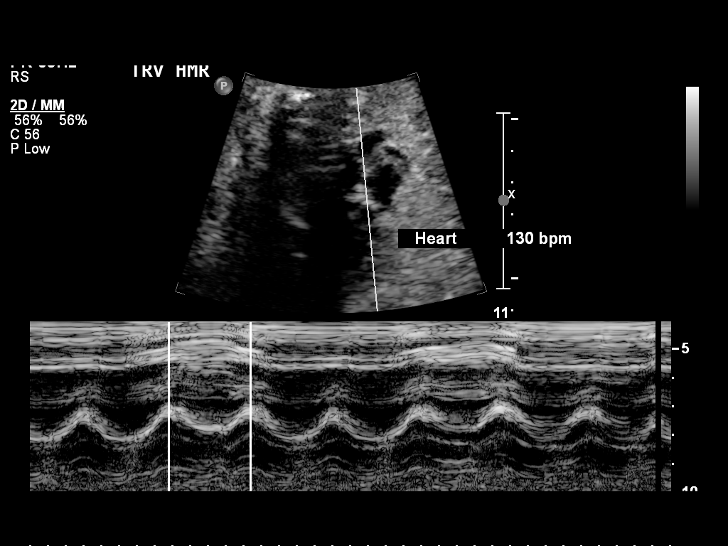
[im 4/18]
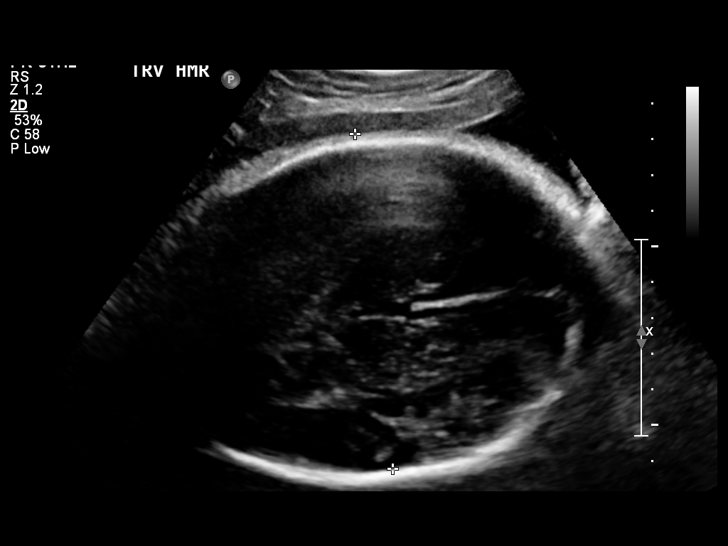
[im 5/18]
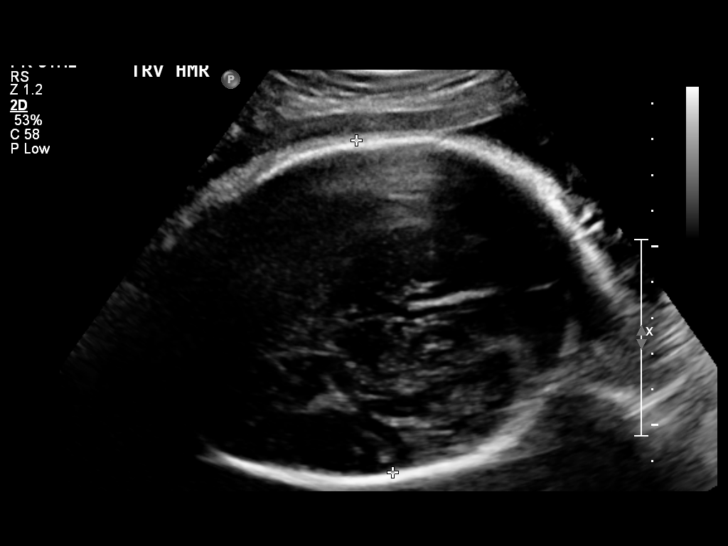
[im 6/18]
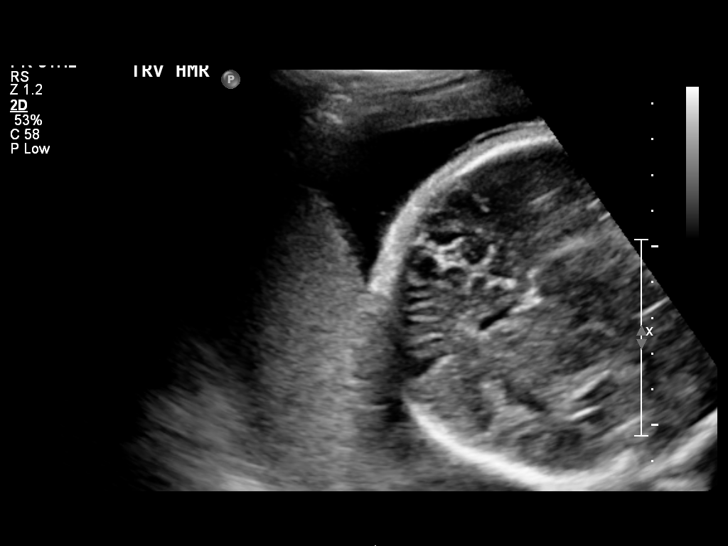
[im 8/18]
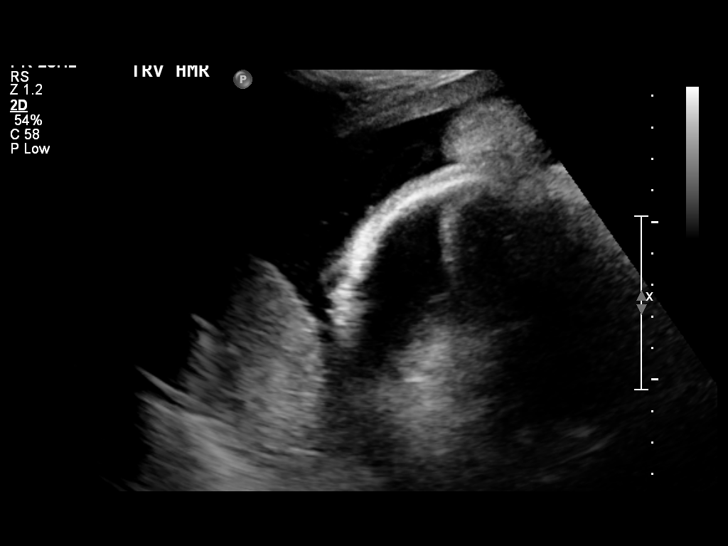
[im 9/18]
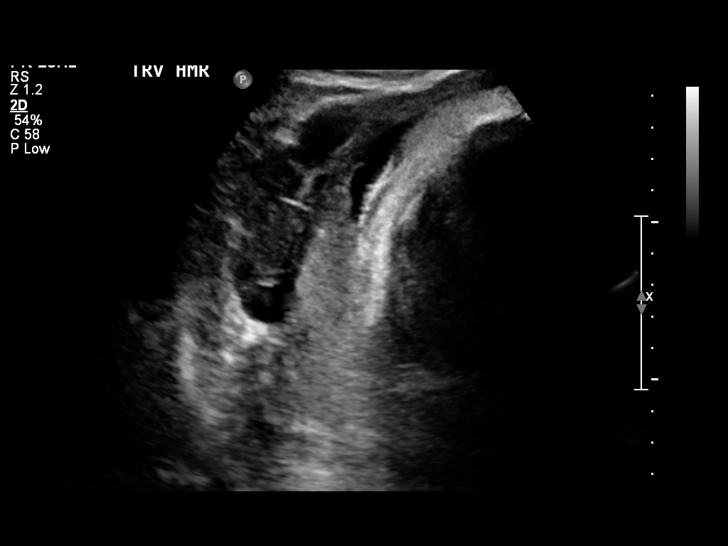
[im 10/18]
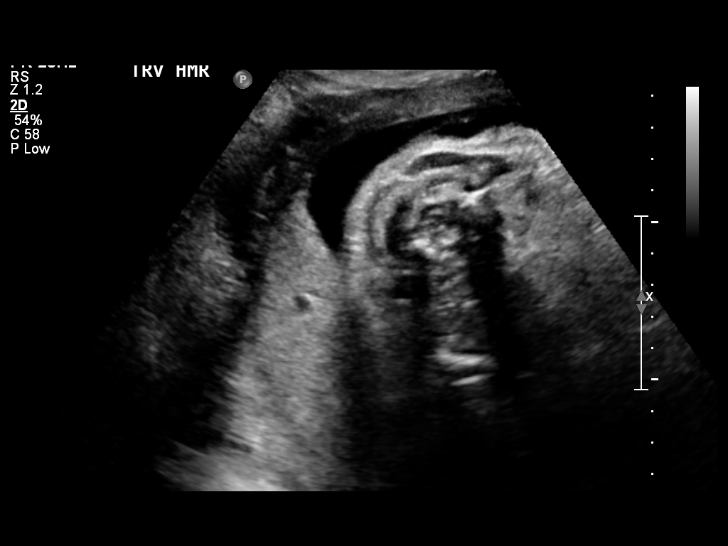
[im 11/18]
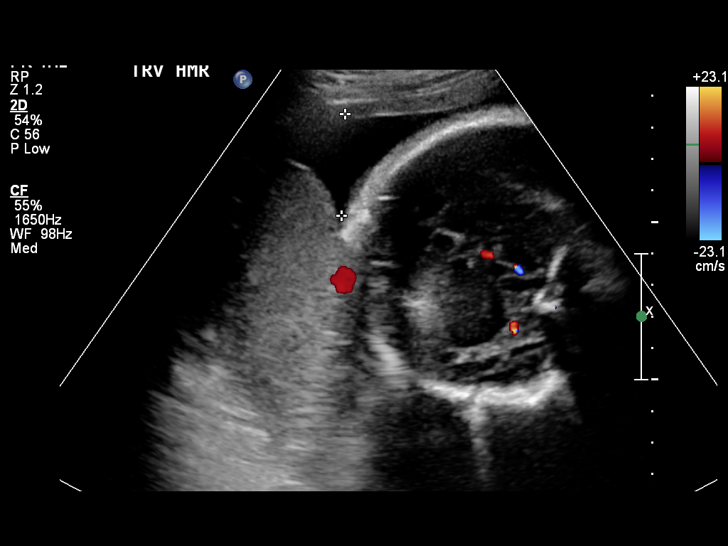
[im 13/18]
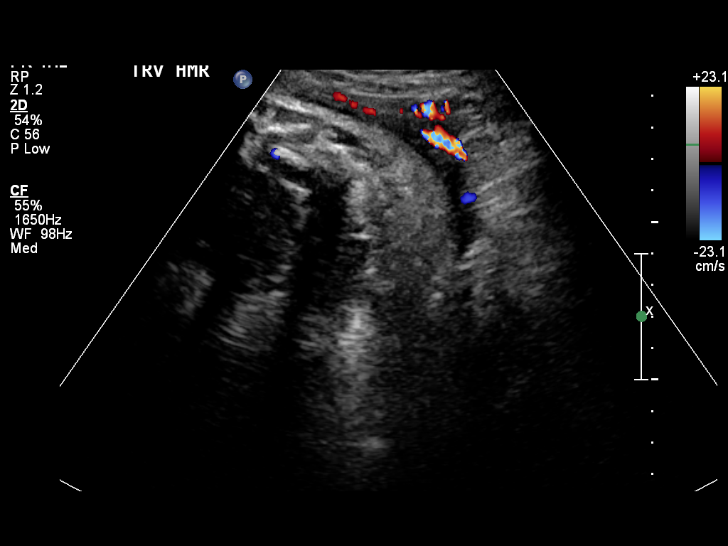
[im 14/18]
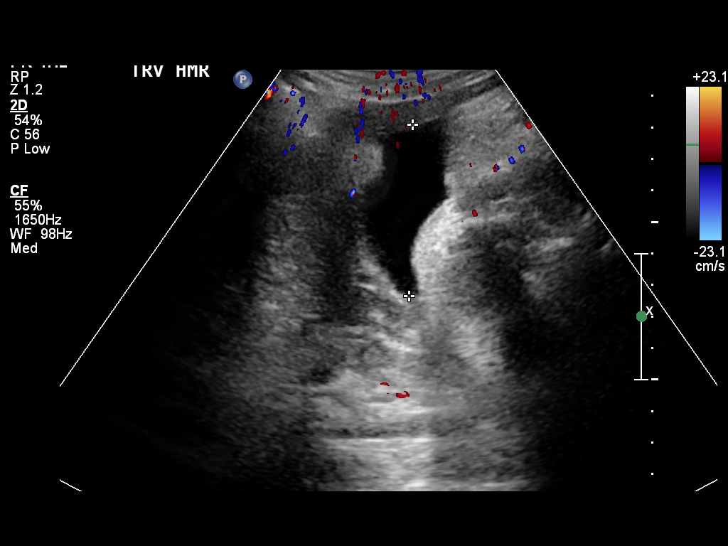
[im 15/18]
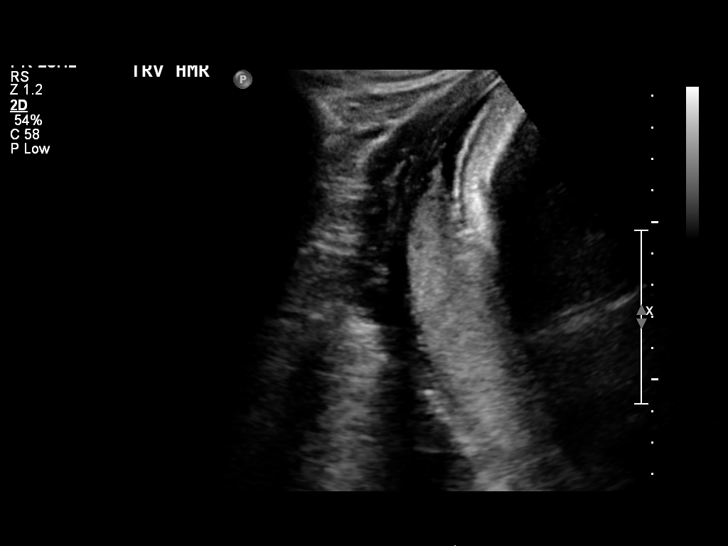
[im 17/18]
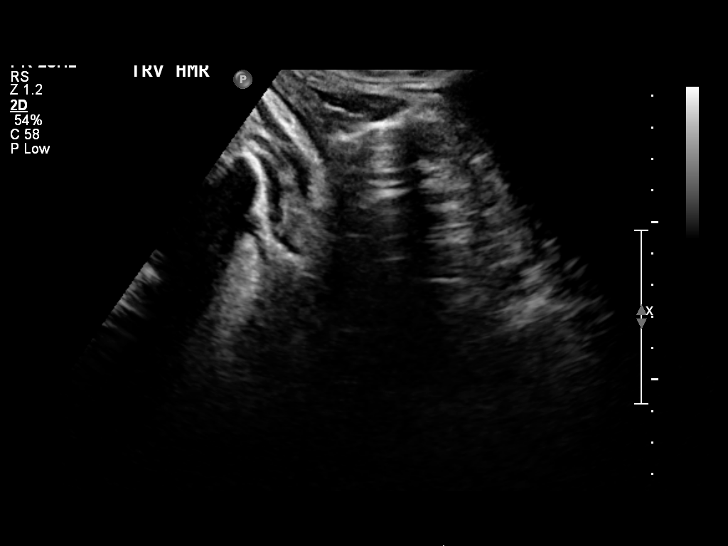
[im 18/18]
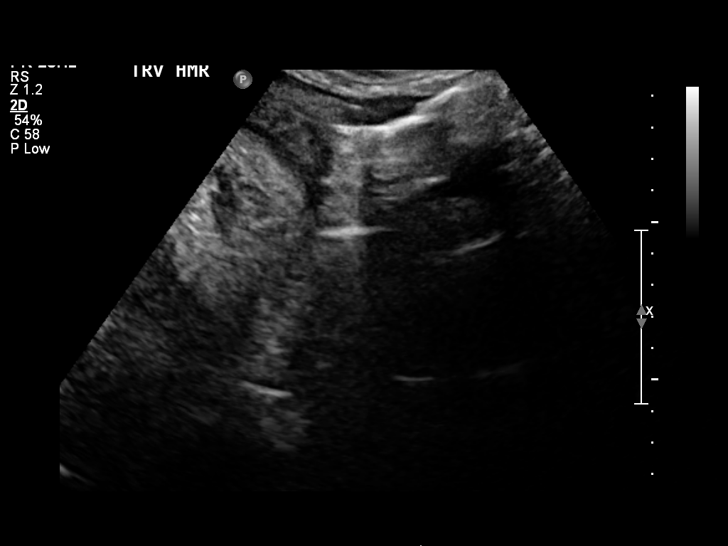

[14 of 18 positions shown; findings below may reference images not displayed]

LIMITED OBSTETRIC ULTRASOUND

Number of Fetuses: 1
Heart Rate: 130 bpm
Movement: Fetal movement is visualized.
Breathing: Not evaluated.
Presentation: Fetus demonstrates transverse presentation with the
head on the maternal right side.
Placental Location: The placenta is posterior.
Previa: No previa.
Amniotic Fluid (Subjective): Normal

AFI 12.28 cm  (5%ile = 7.2 cm; 95%ile = 22.6  cm)

BPD:  9.35 cm     38 w  1 d         EDC:  04/24/2013

MATERNAL FINDINGS:
Cervix: Not visualized
Uterus/Adnexae:  Limited visualization.  No abnormal adnexal masses
appreciated.
IMPRESSION: Fetus is demonstrated in transverse presentation with head to the
maternal right side.

Recommend followup with non-emergent complete OB 14+ wk US
examination for fetal biometric evaluation and anatomic survey if
not already performed.

## 2014-03-18 IMAGING — US US ABDOMEN LIMITED
1 series · 14 of 25 positions shown · non-contrast
Comparison: None.

CLINICAL DATA: Right upper quadrant pain.

LIMITED ABDOMINAL ULTRASOUND - RIGHT UPPER QUADRANT

[Series 1: us abdomen limited · 0.20mm/px · 14 of 52 slices shown]
[im 1/52]
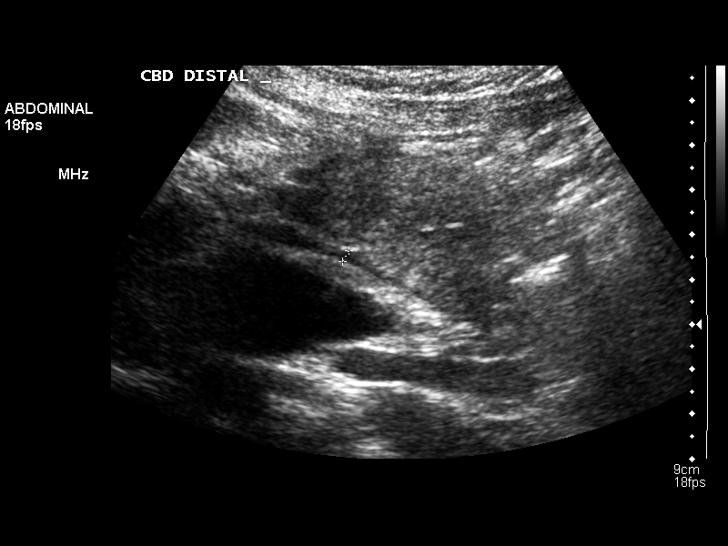
[im 5/52]
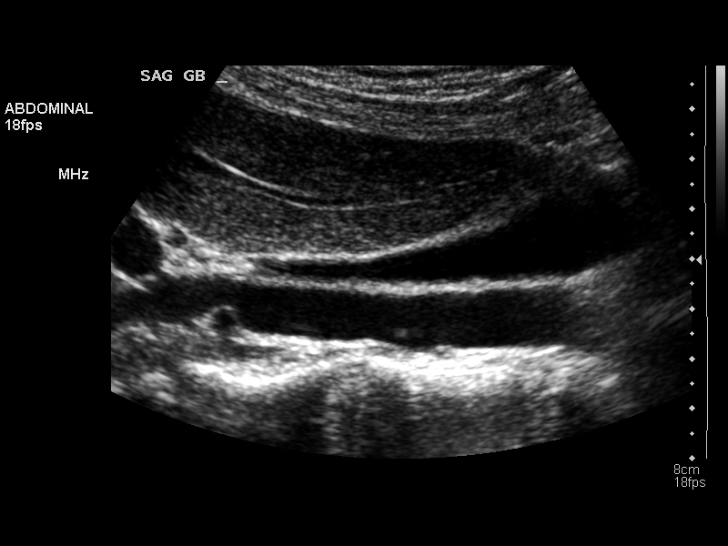
[im 9/52]
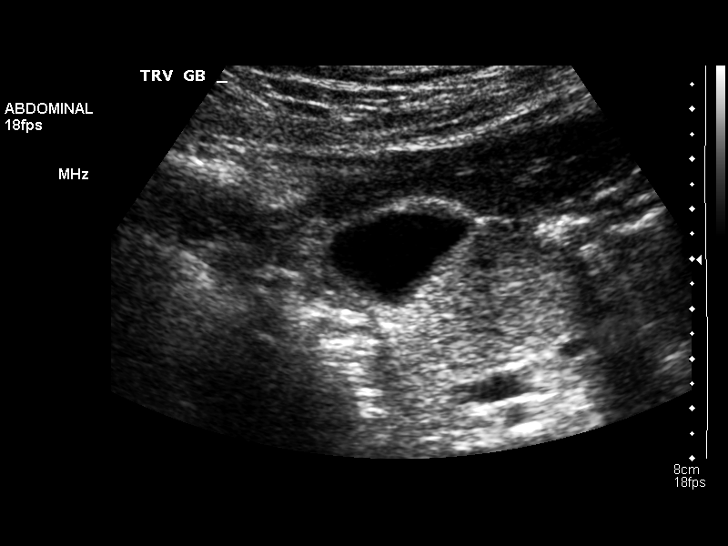
[im 13/52]
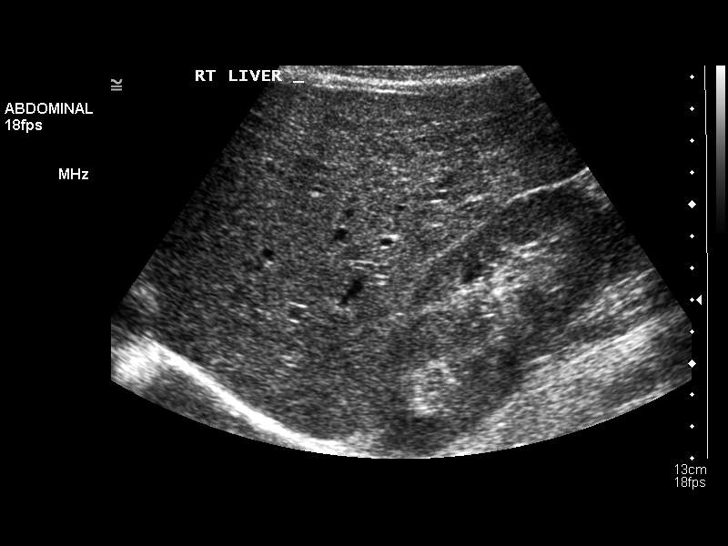
[im 18/52]
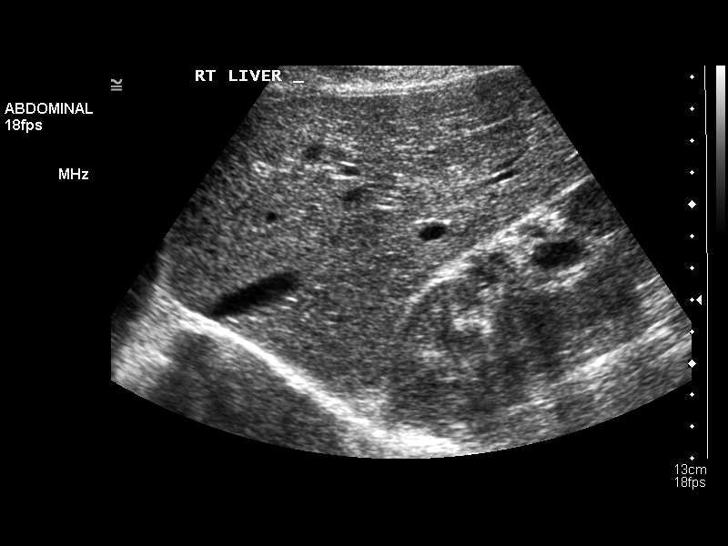
[im 20/52]
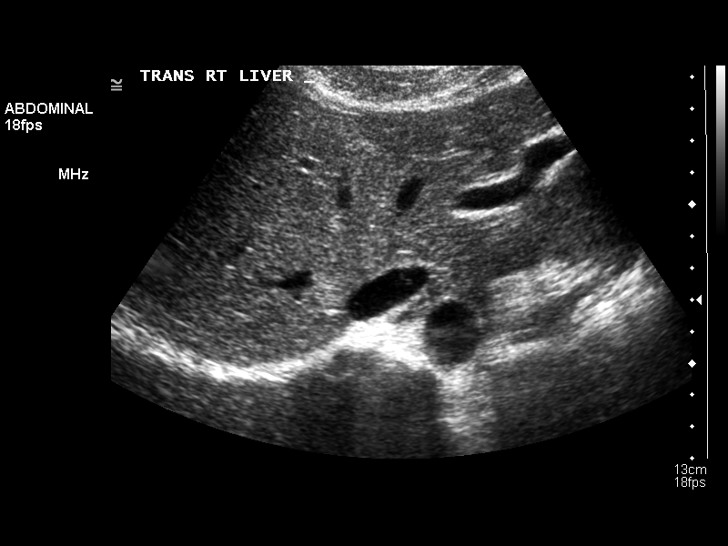
[im 24/52]
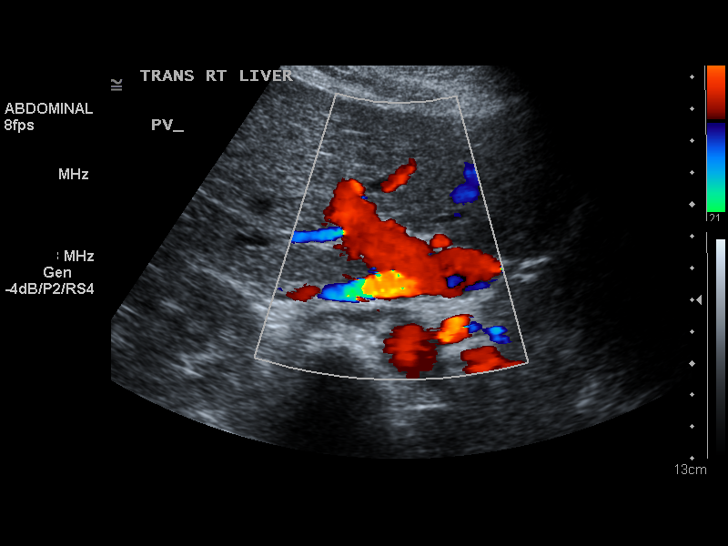
[im 28/52]
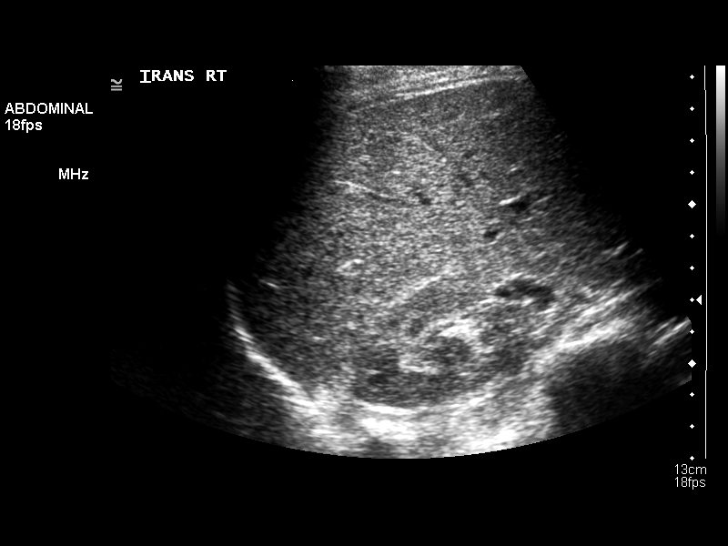
[im 32/52]
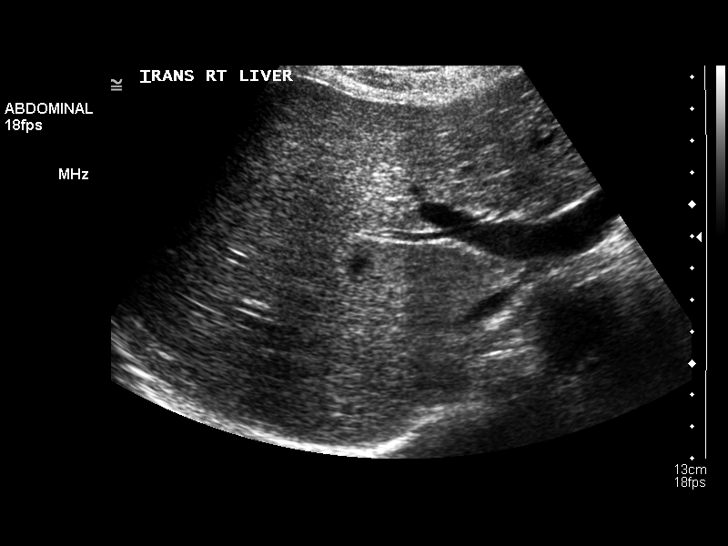
[im 35/52]
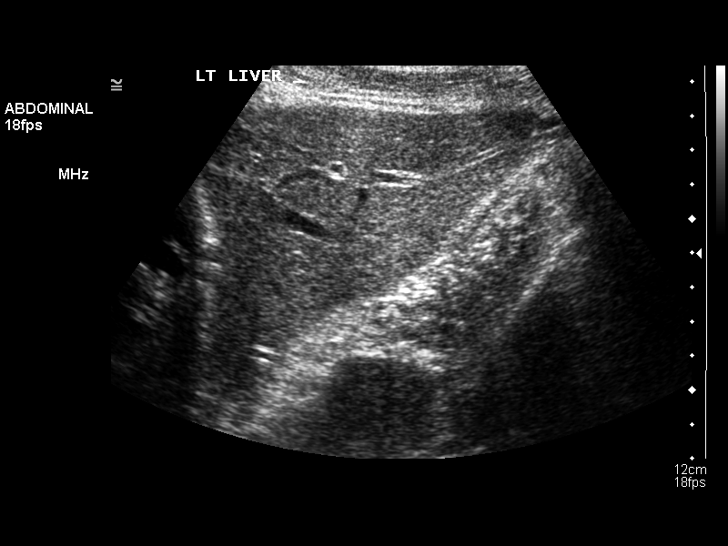
[im 39/52]
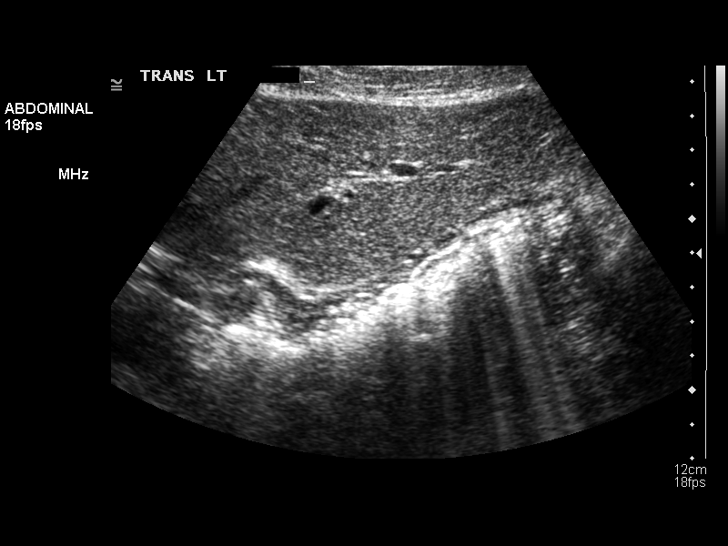
[im 43/52]
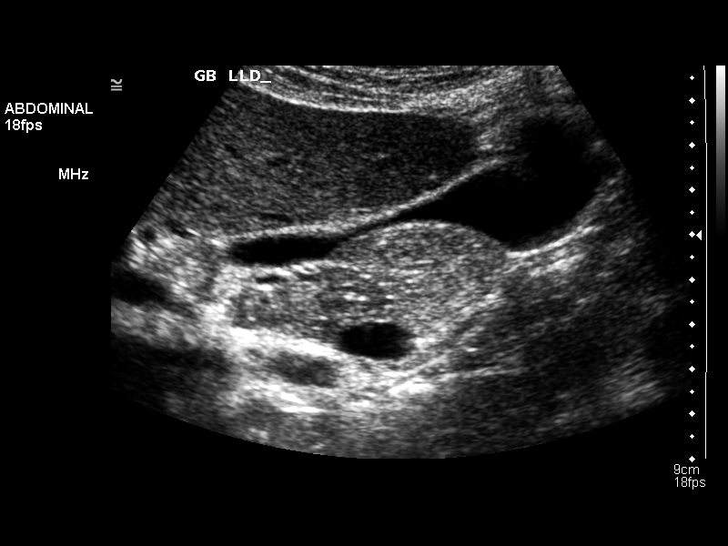
[im 47/52]
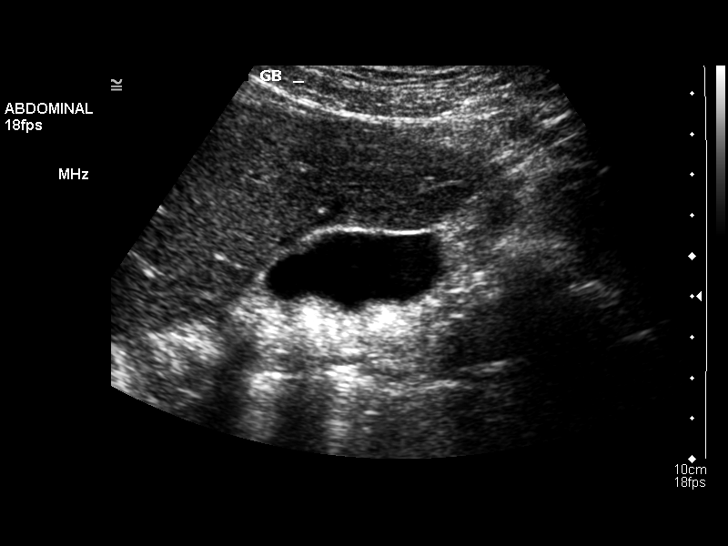
[im 52/52]
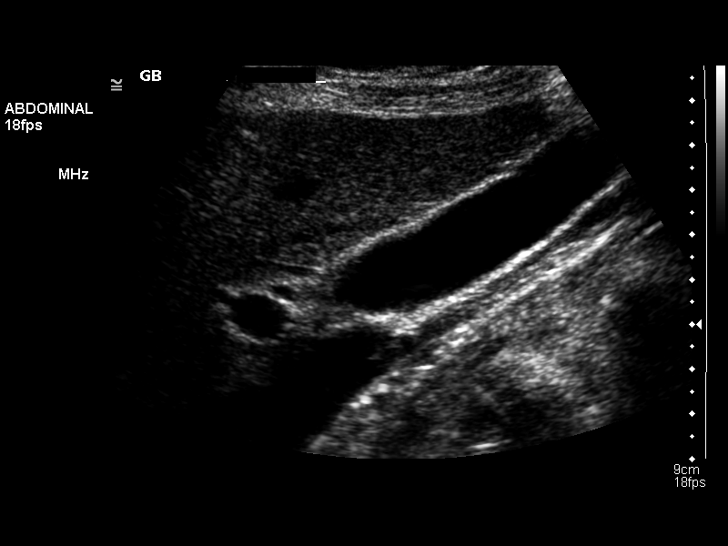

[14 of 25 positions shown; findings below may reference images not displayed]

FINDINGS: Gallbladder:  No gallstones, wall thickening or sonographic
Murphy's sign.

Common bile duct:  Measures 3 mm, within normal limits.

Liver:  Normal in parenchymal echogenicity.  No focal lesions.
IMPRESSION: Negative.

## 2014-09-02 ENCOUNTER — Encounter (HOSPITAL_COMMUNITY): Payer: Self-pay

## 2015-09-15 ENCOUNTER — Encounter: Payer: Self-pay | Admitting: Family Medicine

## 2015-09-15 ENCOUNTER — Ambulatory Visit (INDEPENDENT_AMBULATORY_CARE_PROVIDER_SITE_OTHER): Payer: BC Managed Care – PPO | Admitting: Family Medicine

## 2015-09-15 VITALS — BP 132/84 | HR 89 | Ht 66.0 in | Wt 143.0 lb

## 2015-09-15 DIAGNOSIS — M9902 Segmental and somatic dysfunction of thoracic region: Secondary | ICD-10-CM | POA: Diagnosis not present

## 2015-09-15 DIAGNOSIS — M9903 Segmental and somatic dysfunction of lumbar region: Secondary | ICD-10-CM

## 2015-09-15 DIAGNOSIS — M24551 Contracture, right hip: Secondary | ICD-10-CM | POA: Diagnosis not present

## 2015-09-15 DIAGNOSIS — K30 Functional dyspepsia: Secondary | ICD-10-CM | POA: Diagnosis not present

## 2015-09-15 DIAGNOSIS — M9905 Segmental and somatic dysfunction of pelvic region: Secondary | ICD-10-CM

## 2015-09-15 DIAGNOSIS — M2170 Unequal limb length (acquired), unspecified site: Secondary | ICD-10-CM | POA: Diagnosis not present

## 2015-09-15 DIAGNOSIS — R109 Unspecified abdominal pain: Secondary | ICD-10-CM | POA: Insufficient documentation

## 2015-09-15 DIAGNOSIS — M999 Biomechanical lesion, unspecified: Secondary | ICD-10-CM

## 2015-09-15 NOTE — Assessment & Plan Note (Signed)
Decision today to treat with OMT was based on Physical Exam  After verbal consent patient was treated with HVLA, ME, FPR techniques in thoracic, lumbar and hip areas  Patient tolerated the procedure well with improvement in symptoms  Patient given exercises, stretches and lifestyle modifications  See medications in patient instructions if given  Patient will follow up in 3-4 weeks

## 2015-09-15 NOTE — Assessment & Plan Note (Signed)
Patient does have more tightness of the hip flexor. Seems to be on the right side. This could be contributing to some of the abdominal pain that she is having. I do think the differential includes gastroesophageal reflux disease. Patient does have a leg length discrepancy that could cause increasing tightness of the hip flexor. We discussed a heel lift. We discussed home exercises and patient work with Product/process development scientist. Patient will try topical anti-inflammatories for the pain. Patient will remain active. Come back in 3 weeks for further evaluation and treatment.

## 2015-09-15 NOTE — Assessment & Plan Note (Signed)
Patient does have more of a leg length discrepancy overall. We discussed icing regimen, home exercises, and a heel lift.

## 2015-09-15 NOTE — Progress Notes (Signed)
[facility] [place], [place] [address] Phone: [phone] Subjective:    I'm seeing this patient by the request  of:  No primary care provider on file.   CC: Pain with history of scoliosis  [id number] Autumn Fuller is a 39 y.o. female coming in with complaint of back pain and history of scoliosis. Patient states that she is been having more of a abdominal pain on the right upper right quadrant. Has had this pain intermittent. Seem to be worse when she was having her children. Patient states this seems to be more with activity. Patient states that sometimes she can have that even at rest. Does not know any significant exacerbating factors other than trying to run or do a lot of lifting on this side. Patient does have 2 young children and doesn't know that stress seems to exacerbate it. No rash noted. Has been going to formal physical therapy with no significant improvement. Patient states it does respond to anti-inflammatories when she takes it but she would like to do things more natural. Patient is concerned that something is being this. Patient was referred here by physical therapy to see if there is anything else that could beneficial  Denies any numbness, denies any weight loss, denies any nighttime awakening. Not taking any pain medications.    Past Medical History  Diagnosis Date  . 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 Anxiety   . HSV (herpes simplex virus) infection     last outbreak 2003  . Postpartum care following vaginal delivery (04/11/13) 04/12/2013  . SVD (spontaneous vaginal delivery) 04/12/2013   Past Surgical History  Procedure Laterality Date  . Mouth surgery     Social History  Substance Use Topics  . Smoking status: Never Smoker   . Smokeless tobacco: None  . Alcohol Use: No   No Known Allergies No family history on file.  Past medical history, social, surgical and family history all reviewed in  electronic medical record.   Review of Systems: No headache, visual changes, nausea, vomiting, diarrhea, constipation, dizziness, abdominal pain, skin rash, fevers, chills, night sweats, weight loss, swollen lymph nodes, body aches, joint swelling, muscle aches, chest pain, shortness of breath, mood changes.   Objective Blood pressure 132/84, pulse 89, height 5\' 6"  (1.676 m), weight 143 lb (64.864 kg), SpO2 96 %.  General: No apparent distress alert and oriented x3 mood and affect normal, dressed appropriately.  HEENT: Pupils equal, extraocular movements intact  Respiratory: Patient's speak in full sentences and does not appear short of breath  Cardiovascular: No lower extremity edema, non tender, no erythema  Skin: Warm dry intact with no signs of infection or rash on extremities or on axial skeleton.  Abdomen: Soft minimal tenderness in the epigastric region but no rebound tenderness or involuntary guarding. Neuro: Cranial nerves II through XII are intact, neurovascularly intact in all extremities with 2+ DTRs and 2+ pulses.  Lymph: No lymphadenopathy of posterior or anterior cervical chain or axillae bilaterally.  Gait normal with good balance and coordination.  MSK:  Non tender with full range of motion and good stability and symmetric strength and tone of shoulders, elbows, wrist,  knee and ankles bilaterally.  Good core strength Back exam shows the patient does have some very minimal levoscoliosis of the thoracolumbar junction. Patient is nontender with mild tightness over the right hip flexor. Full range of motion of the hips bilaterally. Neurovascularly intact distally. Leg  length discrepancy noted with approximately half inch shorter on the right side  Osteopathic findings T7 extended rotated and side bent right T12 extended rotated and side bent right L2 flexed rotated and side bent right Right anterior ilium     Impression and Recommendations:     This case required medical  decision making of moderate complexity.

## 2015-09-15 NOTE — Assessment & Plan Note (Signed)
She has had pain in this area. Workup including MRI of the abdominal region that was unremarkable. Patient does have a past medical history significant for leukemia as a child but workup has been normal including laboratory results. We discussed that patient likely has more of a reflux disease. Patient is going to consider trying over-the-counter proton pump inhibitor. We attempted osteopathic manipulation with no significant improvement. This may help with some of the hip flexor pain but I do not begin will help with the abdominal pain. No slipped rib syndrome noted today. Patient tried this and come back in 3 weeks. Patient does respond well to the proton pump inhibitor we will consider

## 2015-09-15 NOTE — Progress Notes (Signed)
Pre visit review using our clinic review tool, if applicable. No additional management support is needed unless otherwise documented below in the visit note. 

## 2015-09-15 NOTE — Patient Instructions (Signed)
Good to see you.  Ice 20 minutes 2 times daily. Usually after activity and before bed. Prilosec 2 times daily for next 2 weeks Exercises 3 times a week.  Consider a food journal pennsaid pinkie amount topically 2 times daily as needed. \ Vitamin D 2000 IU daily Turmeric 500mg  twice daily but start the prilosec. Heel lift in right shoe at all times ([email or web address] 1/8 inch) See me again in 3 weeks.

## 2015-10-07 ENCOUNTER — Ambulatory Visit (INDEPENDENT_AMBULATORY_CARE_PROVIDER_SITE_OTHER): Payer: BC Managed Care – PPO | Admitting: Family Medicine

## 2015-10-07 ENCOUNTER — Encounter: Payer: Self-pay | Admitting: Family Medicine

## 2015-10-07 VITALS — BP 126/70 | HR 75 | Ht 66.0 in | Wt 143.0 lb

## 2015-10-07 DIAGNOSIS — M24551 Contracture, right hip: Secondary | ICD-10-CM

## 2015-10-07 DIAGNOSIS — M999 Biomechanical lesion, unspecified: Secondary | ICD-10-CM

## 2015-10-07 DIAGNOSIS — M9902 Segmental and somatic dysfunction of thoracic region: Secondary | ICD-10-CM | POA: Diagnosis not present

## 2015-10-07 DIAGNOSIS — M9903 Segmental and somatic dysfunction of lumbar region: Secondary | ICD-10-CM | POA: Diagnosis not present

## 2015-10-07 DIAGNOSIS — M9905 Segmental and somatic dysfunction of pelvic region: Secondary | ICD-10-CM | POA: Diagnosis not present

## 2015-10-07 MED ORDER — DICLOFENAC SODIUM 2 % TD SOLN: TRANSDERMAL | Status: DC

## 2015-10-07 NOTE — Progress Notes (Signed)
  [facility] [place], [place] [address] Phone: [phone] Subjective:     CC: Pain with history of scoliosis follow up  [id number] Autumn Fuller is a 39 y.o. female coming in with complaint of back pain and history of scoliosis. Patient was seen previously and did have some muscle aches and pains. Did respond well to osteopathic manipulation. Patient has a past medical history significant for leukemia but no systemic illnesses. Patient has had difficulty with anxiety. Patient is on a medication for right now but does not feel that is working. Feels that the anxiety does cause increasing muscle pain. Patient has gone to heel lift and has noticed improvement as well. States that the severe pain she was having in the left hip is significantly better at this time. Notices less tightness of the upper back and lower back. Would state that she is approximately 40% better overall.   Past Medical History  Diagnosis Date  . 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 Anxiety   . HSV (herpes simplex virus) infection     last outbreak 2003  . Postpartum care following vaginal delivery (04/11/13) 04/12/2013  . SVD (spontaneous vaginal delivery) 04/12/2013   Past Surgical History  Procedure Laterality Date  . Mouth surgery     Social History  Substance Use Topics  . Smoking status: Never Smoker   . Smokeless tobacco: None  . Alcohol Use: No   No Known Allergies No family history on file.  Past medical history, social, surgical and family history all reviewed in electronic medical record.   Review of Systems: No headache, visual changes, nausea, vomiting, diarrhea, constipation, dizziness, abdominal pain, skin rash, fevers, chills, night sweats, weight loss, swollen lymph nodes, body aches, joint swelling, muscle aches, chest pain, shortness of breath, mood changes.   Objective Blood pressure 126/70, pulse 75, height 5\' 6"  (1.676 m), weight 143  lb (64.864 kg), SpO2 98 %.  General: No apparent distress alert and oriented x3 mood and affect normal, dressed appropriately.  HEENT: Pupils equal, extraocular movements intact  Respiratory: Patient's speak in full sentences and does not appear short of breath  Cardiovascular: No lower extremity edema, non tender, no erythema  Skin: Warm dry intact with no signs of infection or rash on extremities or on axial skeleton.  Abdomen: Nontender on exam today which is an improvement. Neuro: Cranial nerves II through XII are intact, neurovascularly intact in all extremities with 2+ DTRs and 2+ pulses.  Lymph: No lymphadenopathy of posterior or anterior cervical chain or axillae bilaterally.  Gait normal with good balance and coordination.  MSK:  Non tender with full range of motion and good stability and symmetric strength and tone of shoulders, elbows, wrist,  knee and ankles bilaterally.  Good core strength Back exam shows the patient does have some very minimal levoscoliosis of the thoracolumbar junction. He tightness of the hip flexors but full strength that is symmetric bilaterally. Still some mild weakness of the hip abductors bilaterally. Leg length discrepancy noted with approximately half inch shorter on the right side  Osteopathic findings T7 extended rotated and side bent right T12 extended rotated and side bent right L2 flexed rotated and side bent right Right anterior ilium     Impression and Recommendations:     This case required medical decision making of moderate complexity.

## 2015-10-07 NOTE — Patient Instructions (Signed)
Good to see you Ice is your friend Ask about effexor as a possible switch pennsaid pinkie amount topically 2 times daily as needed.  Continue the exercises and the heel lift.  See me again in 4 weeks.  Happy holidays!

## 2015-10-07 NOTE — Progress Notes (Signed)
Pre visit review using our clinic review tool, if applicable. No additional management support is needed unless otherwise documented below in the visit note. 

## 2015-10-07 NOTE — Assessment & Plan Note (Signed)
Changes and tightness overall. I do think the patient anxiety does play a role. Patient's will discuss different medication changes with her psychiatrist. We discussed continuing home exercises. Did respond well to the osteopathic manipulation again. More core strengthening. Patient come back in 4-5 weeks.

## 2015-10-07 NOTE — Assessment & Plan Note (Signed)
Decision today to treat with OMT was based on Physical Exam  After verbal consent patient was treated with HVLA, ME, FPR techniques in thoracic, lumbar and hip areas  Patient tolerated the procedure well with improvement in symptoms  Patient given exercises, stretches and lifestyle modifications  See medications in patient instructions if given  Patient will follow up in 4-5 weeks

## 2015-10-22 ENCOUNTER — Other Ambulatory Visit: Payer: Self-pay | Admitting: Orthopaedic Surgery

## 2015-10-22 DIAGNOSIS — M546 Pain in thoracic spine: Secondary | ICD-10-CM

## 2015-10-29 ENCOUNTER — Ambulatory Visit
Admission: RE | Admit: 2015-10-29 | Discharge: 2015-10-29 | Disposition: A | Discharge status: Home / Self Care | Payer: BC Managed Care – PPO | Source: Ambulatory Visit | Attending: Orthopaedic Surgery | Admitting: Orthopaedic Surgery

## 2015-10-29 DIAGNOSIS — M546 Pain in thoracic spine: Secondary | ICD-10-CM

## 2015-11-06 ENCOUNTER — Ambulatory Visit: Payer: BC Managed Care – PPO | Admitting: Family Medicine

## 2016-06-15 ENCOUNTER — Other Ambulatory Visit: Payer: Self-pay | Admitting: Obstetrics and Gynecology

## 2016-06-15 DIAGNOSIS — R109 Unspecified abdominal pain: Secondary | ICD-10-CM

## 2016-06-21 ENCOUNTER — Other Ambulatory Visit: Payer: BC Managed Care – PPO

## 2016-06-22 ENCOUNTER — Other Ambulatory Visit: Payer: BC Managed Care – PPO

## 2016-06-29 ENCOUNTER — Ambulatory Visit
Admission: RE | Admit: 2016-06-29 | Discharge: 2016-06-29 | Disposition: A | Discharge status: Home / Self Care | Payer: BC Managed Care – PPO | Source: Ambulatory Visit | Attending: Obstetrics and Gynecology | Admitting: Obstetrics and Gynecology

## 2016-06-29 DIAGNOSIS — R109 Unspecified abdominal pain: Secondary | ICD-10-CM

## 2016-06-29 MED ORDER — IOPAMIDOL (ISOVUE-300) INJECTION 61%
100.0000 mL | Freq: Once | INTRAVENOUS | Status: AC | PRN
  Administered 2016-06-29: 100 mL via INTRAVENOUS

## 2019-08-20 ENCOUNTER — Ambulatory Visit
Admission: RE | Admit: 2019-08-20 | Discharge: 2019-08-20 | Disposition: A | Discharge status: Home / Self Care | Payer: BC Managed Care – PPO | Source: Ambulatory Visit | Attending: Family Medicine | Admitting: Family Medicine

## 2019-08-20 ENCOUNTER — Ambulatory Visit: Payer: BC Managed Care – PPO | Admitting: Family Medicine

## 2019-08-20 ENCOUNTER — Encounter: Payer: Self-pay | Admitting: Family Medicine

## 2019-08-20 ENCOUNTER — Other Ambulatory Visit: Payer: Self-pay

## 2019-08-20 VITALS — BP 118/82 | Ht 67.0 in | Wt 145.0 lb

## 2019-08-20 DIAGNOSIS — M25552 Pain in left hip: Secondary | ICD-10-CM

## 2019-08-20 DIAGNOSIS — M169 Osteoarthritis of hip, unspecified: Secondary | ICD-10-CM | POA: Insufficient documentation

## 2019-08-20 NOTE — Patient Instructions (Signed)
I'm concerned you have irritated the labrum of your hip, possibly have a small tear here. Most of these heal with conservative treatment though. Get x-rays after you leave today - we will call you with the results. Start physical therapy and do home exercises on days you don't go to therapy. I would recommend avoiding running for now unless physical therapy works you back into this. Tylenol, ibuprofen, icing only if needed. Follow up with me in 6 weeks.

## 2019-08-20 NOTE — Assessment & Plan Note (Signed)
Femoral stress fracture vs SI joint pain vs piriformis syndrome vs IT Band syndrome vs hip labral impingement all considered. Given her history and physical exam unlikely to have femoral stress fracture. Her symptoms are most consistent with left hip labral impingement vs tear. - PT for labrum of the hip - Left Hip x-rays to look for signs of OA - f/u with [place] in 6 weeks; if no improvement obtain MRI

## 2019-08-20 NOTE — Progress Notes (Signed)
     Subjective: HPI: Autumn Fuller is a 43 y.o. presenting to clinic today to discuss the following:  Left Hip Pain Patient states she increased her running activity starting in March and then began having pain located in her left groin and on the outside of her left upper thigh. No back pain. Anything cardio (running, walking, etc) makes it worse. It gets better with relative rest and ice. The pain is described as sharp with exercise, then dull and achy after rated at a 6/10 at its worst that never really goes away. It feels like "something is pulling". No injury or trauma. She has not been running for the past few months and the pain hasn't stopped. She was running 2-3 miles per day. She did say her mother had early onset hip osteoarthritis.  No swelling, numbness.  ROS noted in HPI.    Social History   Tobacco Use  Smoking Status Never Smoker   Objective: BP 118/82   Ht 5\' 7"  (1.702 m)   Wt 145 lb (65.8 kg)   BMI 22.71 kg/m  Vitals and nursing notes reviewed  Physical Exam Hip, Left: TTP noted at lateral side of the left hip. No obvious rash, erythema, ecchymosis, or edema. ROM full in all directions (IR: 80/ ER: 80/Flex: 120/Ext: 100/Abd: 45/Add: 45) with pain on internal and external rotation; Strength 5/5 in IR/ER/Flex/Ext/Abd/Add. Pelvic alignment unremarkable to inspection and palpation. Standing hip rotation and gait without trendelenburg / unsteadiness. Greater trochanter without tenderness to palpation. No tenderness over piriformis. No SI joint tenderness and normal minimal SI movement. Negative fulcrum test and negative hop test.  Right hip: No deformity, instability. FROM with 5/5 strength. No tenderness to palpation. NVI distally.  Assessment/Plan:  Left hip pain Femoral stress fracture vs SI joint pain vs piriformis syndrome vs IT Band syndrome vs hip labral impingement all considered. Given her history and physical exam unlikely to have femoral stress  fracture. Her symptoms are most consistent with left hip labral impingement vs tear. - PT for labrum of the hip - Left Hip x-rays to look for signs of OA - f/u with [place] in 6 weeks; if no improvement obtain MRI   PATIENT EDUCATION PROVIDED: See AVS    Diagnosis and plan along with any newly prescribed medication(s) were discussed in detail with this patient today. The patient verbalized understanding and agreed with the plan. Patient advised if symptoms worsen return to clinic or ER.     Orders Placed This Encounter  Procedures  . DG HIP UNILAT WITH PELVIS 2-3 VIEWS LEFT    Standing Status:   Future    Standing Expiration Date:   10/19/2020    Order Specific Question:   Reason for Exam (SYMPTOM  OR DIAGNOSIS REQUIRED)    Answer:   left hip pain    Order Specific Question:   Is patient pregnant?    Answer:   No    Order Specific Question:   Preferred imaging location?    Answer:   [facility]    Order Specific Question:   Radiology Contrast Protocol - do NOT remove file path    Answer:   [email or web address]     Harolyn Rutherford, DO 08/20/2019, 10:19 AM PGY-3 [place]

## 2019-09-19 ENCOUNTER — Other Ambulatory Visit: Payer: Self-pay

## 2019-09-19 DIAGNOSIS — Z20822 Contact with and (suspected) exposure to covid-19: Secondary | ICD-10-CM

## 2019-09-21 LAB — NOVEL CORONAVIRUS, NAA: SARS-CoV-2, NAA: NOT DETECTED

## 2019-10-01 ENCOUNTER — Ambulatory Visit: Payer: BC Managed Care – PPO | Admitting: Family Medicine

## 2019-10-08 ENCOUNTER — Other Ambulatory Visit: Payer: Self-pay

## 2019-10-08 ENCOUNTER — Ambulatory Visit: Payer: BC Managed Care – PPO | Admitting: Family Medicine

## 2019-10-08 VITALS — BP 132/88 | Ht 67.0 in | Wt 150.0 lb

## 2019-10-08 DIAGNOSIS — M25552 Pain in left hip: Secondary | ICD-10-CM

## 2019-10-08 NOTE — Assessment & Plan Note (Signed)
Patient still with pain in left hip and through groin.  Often starts mid thigh and radiates to the knee.  This is worse with flexion and external rotation of the hip or with internal rotation of the hip.  Also worse at extreme range of motion for flexion.  Patient has been doing physical therapy exercises for the last 6 weeks and taken over-the-counter medication with no resolution to her pain.  Of note is family history of mother getting hip replacement 55  Having failed 6 weeks of conservative therapy, will order MRI left hip.  After this we will consider surgical consultation.  Can continue over-the-counter pain medication as well as rehab exercises as tolerated

## 2019-10-08 NOTE — Progress Notes (Signed)
    Subjective:  Autumn Fuller is a 43 y.o. female who presents to the [facility] today with a chief complaint of follow-up for left hip pain.   HPI: Left hip pain Patient still with pain in left hip and through groin.  Often starts mid thigh and radiates to the knee.  This is worse with flexion and external rotation of the hip or with internal rotation of the hip.  Also worse at extreme range of motion for flexion.  Patient has been doing physical therapy exercises for the last 6 weeks and taken over-the-counter medication with no resolution to her pain.  She has not had any weakness or paresthesia, she denies any pain radiating from the back.  No incontinence.  No falls.  Of note is family history of mother getting hip replacement at age 58  Objective:  Physical Exam: BP 132/88   Ht 5\' 7"  (1.702 m)   Wt 150 lb (68 kg)   BMI 23.49 kg/m   Gen: NAD, pleasant and conversing comfortably Pulm: NWOB, no cough MSK: Patient was wearing jeans but no obvious swelling.  No instability.  No focal tenderness to palpation.  Patient did have full range of motion with flexion extension but with pain at flexion of left hip.  Did have reduced range of motion due to pain with internal rotation of the hip.  Also restricted slightly with flexion and external rotation of the hip.  No range of motion restrictions to the knee.  No pain at the greater trochanter or SI joint.  Positive logroll, pain in groin with faber. Skin: warm, dry Neuro: grossly normal, moves all extremities Psych: Normal affect and thought content  Assessment/Plan:  Left hip pain Patient still with pain in left hip and through groin.  Often starts mid thigh and radiates to the knee.  This is worse with flexion and external rotation of the hip or with internal rotation of the hip.  Also worse at extreme range of motion for flexion.  Patient has been doing physical therapy exercises for the last 6 weeks and taken over-the-counter medication with no  resolution to her pain.  Of note is family history of mother getting hip replacement at age 63  Having failed 6 weeks of conservative therapy, will order MRI left hip.  After this we will consider surgical consultation vs intraarticular steroid injection of the hip.  Can continue over-the-counter pain medication as well as rehab exercises as tolerated   Sherene Sires, [place] - PGY3 10/08/2019 3:46 PM

## 2019-10-08 NOTE — Patient Instructions (Signed)
We will go ahead with an MRI of your hip since you're not improving as expected - this will assess for possible other hip pathology including stress fracture, tendon tear, labral tear.   Follow up with me after this to go over results and next steps in a no charge visit.  These are the different medications you can take for this: Tylenol 500mg  1-2 tabs three times a day for pain. Capsaicin, aspercreme, or biofreeze topically up to four times a day may also help with pain. Some supplements that may help: Boswellia extract, curcumin, pycnogenol Aleve 1-2 tabs twice a day with food Continue your home exercises in the meantime.

## 2019-10-09 ENCOUNTER — Encounter: Payer: Self-pay | Admitting: Family Medicine

## 2019-10-16 ENCOUNTER — Ambulatory Visit
Admission: RE | Admit: 2019-10-16 | Discharge: 2019-10-16 | Disposition: A | Discharge status: Home / Self Care | Payer: BC Managed Care – PPO | Source: Ambulatory Visit | Attending: Family Medicine | Admitting: Family Medicine

## 2019-10-16 ENCOUNTER — Other Ambulatory Visit: Payer: Self-pay

## 2019-10-16 DIAGNOSIS — M25552 Pain in left hip: Secondary | ICD-10-CM

## 2019-10-17 ENCOUNTER — Other Ambulatory Visit: Payer: Self-pay | Admitting: Cardiology

## 2019-10-17 ENCOUNTER — Ambulatory Visit: Payer: BC Managed Care – PPO | Attending: Internal Medicine

## 2019-10-17 DIAGNOSIS — Z20822 Contact with and (suspected) exposure to covid-19: Secondary | ICD-10-CM

## 2019-10-19 LAB — NOVEL CORONAVIRUS, NAA: SARS-CoV-2, NAA: NOT DETECTED

## 2019-10-22 ENCOUNTER — Telehealth (INDEPENDENT_AMBULATORY_CARE_PROVIDER_SITE_OTHER): Payer: BC Managed Care – PPO | Admitting: Family Medicine

## 2019-10-22 ENCOUNTER — Encounter: Payer: Self-pay | Admitting: Family Medicine

## 2019-10-22 VITALS — Ht 67.0 in | Wt 150.0 lb

## 2019-10-22 DIAGNOSIS — M25552 Pain in left hip: Secondary | ICD-10-CM

## 2019-10-22 NOTE — Progress Notes (Signed)
MRI results reviewed and discussed with patient.  She has more advanced arthritis than expected for age.  Associated effusion and bony edema, cam morphology.  Discussed options and will go ahead with intraarticular steroid injection.  She will come in next week for this.

## 2019-10-29 ENCOUNTER — Ambulatory Visit: Payer: BC Managed Care – PPO | Admitting: Family Medicine

## 2019-11-05 ENCOUNTER — Ambulatory Visit: Payer: BC Managed Care – PPO | Attending: Internal Medicine

## 2019-11-05 ENCOUNTER — Ambulatory Visit: Payer: BC Managed Care – PPO | Admitting: Family Medicine

## 2019-11-05 DIAGNOSIS — Z20822 Contact with and (suspected) exposure to covid-19: Secondary | ICD-10-CM

## 2019-11-06 LAB — NOVEL CORONAVIRUS, NAA: SARS-CoV-2, NAA: NOT DETECTED

## 2019-11-14 ENCOUNTER — Ambulatory Visit: Payer: Self-pay

## 2019-11-14 ENCOUNTER — Other Ambulatory Visit: Payer: Self-pay

## 2019-11-14 ENCOUNTER — Encounter: Payer: Self-pay | Admitting: Family Medicine

## 2019-11-14 ENCOUNTER — Ambulatory Visit (INDEPENDENT_AMBULATORY_CARE_PROVIDER_SITE_OTHER): Payer: BC Managed Care – PPO | Admitting: Family Medicine

## 2019-11-14 VITALS — BP 122/84 | Ht 67.0 in | Wt 150.0 lb

## 2019-11-14 DIAGNOSIS — M25552 Pain in left hip: Secondary | ICD-10-CM

## 2019-11-14 MED ORDER — METHYLPREDNISOLONE ACETATE 40 MG/ML IJ SUSP
40.0000 mg | Freq: Once | INTRAMUSCULAR | Status: AC
  Administered 2019-11-14: 40 mg via INTRA_ARTICULAR

## 2019-11-14 NOTE — Progress Notes (Signed)
Patient came in today for ultrasound guided left hip injection.  See prior notes for discussion re: arthritis.  Advised her to let us know how she's doing in a week.  After informed written consent timeout was performed, patient was lying supine on exam table.  Area overlying anterior left hip was prepped with alcohol swab x2 then utilizing ultrasound guidance, patients left hip joint was injected with 4:1 bupivicaine: depomedrol.  Patient tolerated procedure well without immediate complications.

## 2020-03-18 NOTE — Telephone Encounter (Signed)
[place] - can you make her an appointment with Ysidro Evert for PRP and contact her?  Thanks!

## 2020-03-19 ENCOUNTER — Encounter: Payer: Self-pay | Admitting: *Deleted

## 2020-03-26 ENCOUNTER — Encounter: Payer: Self-pay | Admitting: Family Medicine

## 2020-03-26 ENCOUNTER — Other Ambulatory Visit: Payer: Self-pay

## 2020-03-26 ENCOUNTER — Ambulatory Visit: Payer: Self-pay

## 2020-03-26 ENCOUNTER — Ambulatory Visit (INDEPENDENT_AMBULATORY_CARE_PROVIDER_SITE_OTHER): Payer: Self-pay | Admitting: Family Medicine

## 2020-03-26 VITALS — BP 136/90 | HR 75 | Ht 67.0 in | Wt 150.0 lb

## 2020-03-26 DIAGNOSIS — M1612 Unilateral primary osteoarthritis, left hip: Secondary | ICD-10-CM

## 2020-03-26 NOTE — Patient Instructions (Signed)
Nice to meet you Please try ice  Please have relative rest for the first week.  Please try light activity for the second week.  Please start moderate activity for the third week.  Please try normal activity for the fourth week.   Please send me a message in MyChart with any questions or updates.  Please see me back in 4 weeks.   --Dr. Raeford Razor  What to do after your procedure    Avoid NSAIDs like ibuprofen.   Acetaminophen can be used for mild pain.   Some brief (10 minutes or less) period of heat or ice therapy will not hurt the therapy, but it is not required.

## 2020-03-26 NOTE — Assessment & Plan Note (Signed)
Acute on chronic in nature.  Has tried conservative therapy for some time.  Had a significant response with the steroid injection but only lasted for 1 month.  Is fairly active and wants to maintain her activity level. -Counseled on home exercise therapy and supportive care. -PRP injection. -Counseled on injection aftercare. -Follow-up in 4 weeks.

## 2020-03-26 NOTE — Progress Notes (Signed)
SHAWNTELLE Fuller - 44 y.o. female MRN [id number]  Date of birth: November 09, 1975  SUBJECTIVE:  Including CC & ROS.  Chief Complaint  Patient presents with  . Hip Pain    left    Autumn Fuller is a 44 y.o. female that is presenting with left hip pain.  She has been dealt with this pain for some time now.  She has received a steroid injection which helped improve her pain significantly but only lasted for about a month.  The pain is significant and worse with any activity.  She has tried physical therapy.  Denies any specific inciting event..  Independent review of the MRI left hip from October 16, 2019 shows significant degenerative change of the joint and effusion.   Review of Systems See HPI   HISTORY: Past Medical, Surgical, Social, and Family History Reviewed & Updated per EMR.   Pertinent Historical Findings include:  Past Medical History:  Diagnosis Date  . Anxiety   . HSV (herpes simplex virus) infection    last outbreak 2003  . 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 Postpartum care following vaginal delivery (04/11/13) 04/12/2013  . SVD (spontaneous vaginal delivery) 04/12/2013    Past Surgical History:  Procedure Laterality Date  . MOUTH SURGERY      No family history on file.  Social History   Socioeconomic History  . Marital status: Married    Spouse name: Not on file  . Number of children: Not on file  . Years of education: Not on file  . Highest education level: Not on file  Occupational History  . Not on file  Tobacco Use  . Smoking status: Never Smoker  . Smokeless tobacco: Never Used  Substance and Sexual Activity  . Alcohol use: No  . Drug use: No  . Sexual activity: Not on file  Other Topics Concern  . Not on file  Social History Narrative  . Not on file   Social Determinants of Health   Financial Resource Strain:   . Difficulty of Paying Living Expenses:   Food Insecurity:   . Worried About Charity fundraiser in the Last Year:   .  Arboriculturist in the Last Year:   Transportation Needs:   . Film/video editor (Medical):   Marland Kitchen Lack of Transportation (Non-Medical):   Physical Activity:   . Days of Exercise per Week:   . Minutes of Exercise per Session:   Stress:   . Feeling of Stress :   Social Connections:   . Frequency of Communication with Friends and Family:   . Frequency of Social Gatherings with Friends and Family:   . Attends Religious Services:   . Active Member of Clubs or Organizations:   . Attends Archivist Meetings:   Marland Kitchen Marital Status:   Intimate Partner Violence:   . Fear of Current or Ex-Partner:   . Emotionally Abused:   Marland Kitchen Physically Abused:   . Sexually Abused:      PHYSICAL EXAM:  VS: BP 136/90   Pulse 75   Ht 5\' 7"  (1.702 m)   Wt 150 lb (68 kg)   BMI 23.49 kg/m  Physical Exam Gen: NAD, alert, cooperative with exam, well-appearing    Aspiration/Injection Procedure Note TSURUKO COITO Mar 02, 1976  Procedure: Injection Indications: left hip pain   Procedure Details Consent: Risks of procedure as well as the alternatives and risks of each were explained to the (patient/caregiver).  Consent  for procedure obtained. Time Out: Verified patient identification, verified procedure, site/side was marked, verified correct patient position, special equipment/implants available, medications/allergies/relevent history reviewed, required imaging and test results available.  Performed.  The area was cleaned with iodine and alcohol swabs.    The left hip joint was injected using 3 cc's of 1% lidocaine with a 22 3 1/2" needle.  The syringe was switched and a syringe of 6 cc's of PRP was injected.  Ultrasound was used. Images were obtained in long views showing the injection.     A sterile dressing was applied.  Patient did tolerate procedure well.     ASSESSMENT & PLAN:   OA (osteoarthritis) of hip Acute on chronic in nature.  Has tried conservative therapy for some time.   Had a significant response with the steroid injection but only lasted for 1 month.  Is fairly active and wants to maintain her activity level. -Counseled on home exercise therapy and supportive care. -PRP injection. -Counseled on injection aftercare. -Follow-up in 4 weeks.

## 2020-04-23 ENCOUNTER — Ambulatory Visit: Payer: BC Managed Care – PPO | Admitting: Family Medicine

## 2020-04-23 ENCOUNTER — Encounter: Payer: Self-pay | Admitting: Family Medicine

## 2020-04-23 ENCOUNTER — Other Ambulatory Visit: Payer: Self-pay

## 2020-04-23 DIAGNOSIS — M1612 Unilateral primary osteoarthritis, left hip: Secondary | ICD-10-CM | POA: Diagnosis not present

## 2020-04-23 NOTE — Progress Notes (Signed)
  Autumn Fuller - 44 y.o. female MRN [id number]  Date of birth: 19-Nov-1975  SUBJECTIVE:  Including CC & ROS.  Chief Complaint  Patient presents with  . Follow-up    left hip    Autumn Fuller is a 44 y.o. female that is following up for her left hip pain.  She received a PRP injection about 4 weeks ago.  Her pain has been worse through most of the time.  She did get some relief in the third week.  She has been trying to exercise but seems to exacerbate her pain as well.   Review of Systems See HPI   HISTORY: Past Medical, Surgical, Social, and Family History Reviewed & Updated per EMR.   Pertinent Historical Findings include:  Past Medical History:  Diagnosis Date  . Anxiety   . HSV (herpes simplex virus) infection    last outbreak 2003  . 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 Postpartum care following vaginal delivery (04/11/13) 04/12/2013  . SVD (spontaneous vaginal delivery) 04/12/2013    Past Surgical History:  Procedure Laterality Date  . MOUTH SURGERY      No family history on file.  Social History   Socioeconomic History  . Marital status: Married    Spouse name: Not on file  . Number of children: Not on file  . Years of education: Not on file  . Highest education level: Not on file  Occupational History  . Not on file  Tobacco Use  . Smoking status: Never Smoker  . Smokeless tobacco: Never Used  Substance and Sexual Activity  . Alcohol use: No  . Drug use: No  . Sexual activity: Not on file  Other Topics Concern  . Not on file  Social History Narrative  . Not on file   Social Determinants of Health   Financial Resource Strain:   . Difficulty of Paying Living Expenses:   Food Insecurity:   . Worried About Charity fundraiser in the Last Year:   . Arboriculturist in the Last Year:   Transportation Needs:   . Film/video editor (Medical):   Marland Kitchen Lack of Transportation (Non-Medical):   Physical Activity:   . Days of Exercise per Week:     . Minutes of Exercise per Session:   Stress:   . Feeling of Stress :   Social Connections:   . Frequency of Communication with Friends and Family:   . Frequency of Social Gatherings with Friends and Family:   . Attends Religious Services:   . Active Member of Clubs or Organizations:   . Attends Archivist Meetings:   Marland Kitchen Marital Status:   Intimate Partner Violence:   . Fear of Current or Ex-Partner:   . Emotionally Abused:   Marland Kitchen Physically Abused:   . Sexually Abused:      PHYSICAL EXAM:  VS: BP (!) 142/88   Pulse 78   Ht 5\' 7"  (1.702 m)   Wt 150 lb (68 kg)   BMI 23.49 kg/m  Physical Exam Gen: NAD, alert, cooperative with exam, well-appearing   ASSESSMENT & PLAN:   OA (osteoarthritis) of hip Pain is been ongoing since the injection.  Still may get a benefit from the PRP injection. -Counseled on home exercise therapy and supportive care. -Provided Pennsaid samples. -Could consider intra-articular saline injection.

## 2020-04-23 NOTE — Assessment & Plan Note (Signed)
Pain is been ongoing since the injection.  Still may get a benefit from the PRP injection. -Counseled on home exercise therapy and supportive care. -Provided Pennsaid samples. -Could consider intra-articular saline injection.

## 2020-04-23 NOTE — Progress Notes (Signed)
Medication Samples have been provided to the patient.  Drug name: Pennsaid       Strength: 2%        Qty: 2 boxes  LOT: [id number]  Exp.Date: 12/2020  Dosing instructions: use a pea size amount and rub gently.  The patient has been instructed regarding the correct time, dose, and frequency of taking this medication, including desired effects and most common side effects.   Sherrie George, [place] 9:21 AM 04/23/2020

## 2020-04-23 NOTE — Patient Instructions (Signed)
Good to see you Please try the pennsaid  Please try ice as needed   Please send me a message in MyChart with any questions or updates.  Please see me back in 4 weeks.   --Dr. Raeford Razor

## 2020-05-26 ENCOUNTER — Ambulatory Visit: Payer: BC Managed Care – PPO | Admitting: Family Medicine

## 2020-05-30 ENCOUNTER — Other Ambulatory Visit: Payer: Self-pay

## 2020-05-30 ENCOUNTER — Ambulatory Visit (INDEPENDENT_AMBULATORY_CARE_PROVIDER_SITE_OTHER): Payer: BC Managed Care – PPO | Admitting: Family Medicine

## 2020-05-30 ENCOUNTER — Encounter: Payer: Self-pay | Admitting: Family Medicine

## 2020-05-30 ENCOUNTER — Ambulatory Visit: Payer: Self-pay

## 2020-05-30 VITALS — BP 148/90 | HR 93 | Ht 67.0 in | Wt 150.0 lb

## 2020-05-30 DIAGNOSIS — M1612 Unilateral primary osteoarthritis, left hip: Secondary | ICD-10-CM | POA: Diagnosis not present

## 2020-05-30 NOTE — Assessment & Plan Note (Signed)
Acutely worsening.  Little to no improvement with the PRP injection. Does get improvement with the steroid injection.  -Counseled on home exercise therapy and supportive care. -Injection. -Could consider Celebrex or gabapentin or muscle relaxer.

## 2020-05-30 NOTE — Progress Notes (Signed)
Autumn Fuller - 44 y.o. female MRN [id number]  Date of birth: 1976-10-21  SUBJECTIVE:  Including CC & ROS.  Chief Complaint  Patient presents with  . Follow-up    left hip    Autumn Fuller is a 44 y.o. female that is presenting with acute worsening of her left hip pain.  She has tried steroid injections as well as PRP.  She gets some improvement with the steroid injection.  She did not receive any improvement with PRP injection.  The pain is around the anterior and posterior aspect of the hip.  She notices radicular type pain intermittently as well.  Denies any recent injury or inciting event.   Review of Systems See HPI   HISTORY: Past Medical, Surgical, Social, and Family History Reviewed & Updated per EMR.   Pertinent Historical Findings include:  Past Medical History:  Diagnosis Date  . Anxiety   . HSV (herpes simplex virus) infection    last outbreak 2003  . 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 Postpartum care following vaginal delivery (04/11/13) 04/12/2013  . SVD (spontaneous vaginal delivery) 04/12/2013    Past Surgical History:  Procedure Laterality Date  . MOUTH SURGERY      No family history on file.  Social History   Socioeconomic History  . Marital status: Married    Spouse name: Not on file  . Number of children: Not on file  . Years of education: Not on file  . Highest education level: Not on file  Occupational History  . Not on file  Tobacco Use  . Smoking status: Never Smoker  . Smokeless tobacco: Never Used  Substance and Sexual Activity  . Alcohol use: No  . Drug use: No  . Sexual activity: Not on file  Other Topics Concern  . Not on file  Social History Narrative  . Not on file   Social Determinants of Health   Financial Resource Strain:   . Difficulty of Paying Living Expenses:   Food Insecurity:   . Worried About Charity fundraiser in the Last Year:   . Arboriculturist in the Last Year:   Transportation Needs:   . Consulting civil engineer (Medical):   Marland Kitchen Lack of Transportation (Non-Medical):   Physical Activity:   . Days of Exercise per Week:   . Minutes of Exercise per Session:   Stress:   . Feeling of Stress :   Social Connections:   . Frequency of Communication with Friends and Family:   . Frequency of Social Gatherings with Friends and Family:   . Attends Religious Services:   . Active Member of Clubs or Organizations:   . Attends Archivist Meetings:   Marland Kitchen Marital Status:   Intimate Partner Violence:   . Fear of Current or Ex-Partner:   . Emotionally Abused:   Marland Kitchen Physically Abused:   . Sexually Abused:      PHYSICAL EXAM:  VS: BP (!) 148/90   Pulse 93   Ht 5\' 7"  (1.702 m)   Wt 150 lb (68 kg)   BMI 23.49 kg/m  Physical Exam Gen: NAD, alert, cooperative with exam, well-appearing MSK:  Left hip: Limited internal rotation. Some weakness with hip flexion. Negative straight leg raise. Neurovascularly intact   Aspiration/Injection Procedure Note Autumn Fuller 1975-12-20  Procedure: Injection Indications: Left hip pain  Procedure Details Consent: Risks of procedure as well as the alternatives and risks of each were explained  to the (patient/caregiver).  Consent for procedure obtained. Time Out: Verified patient identification, verified procedure, site/side was marked, verified correct patient position, special equipment/implants available, medications/allergies/relevent history reviewed, required imaging and test results available.  Performed.  The area was cleaned with iodine and alcohol swabs.    The left hip joint was injected using 5 cc's 1% lidocaine on a 22-gauge 3 inch needle 2 mm at the time of the skin in the tract.  The syringe was switched and a mixture containing 3  cc's of saline and 3 cc's of 0.25% bupivacaine was injected.  Ultrasound was used. Images were obtained in long views showing the injection.     A sterile dressing was applied.  Patient did tolerate  procedure well.     ASSESSMENT & PLAN:   OA (osteoarthritis) of hip Acutely worsening.  Little to no improvement with the PRP injection. Does get improvement with the steroid injection.  -Counseled on home exercise therapy and supportive care. -Injection. -Could consider Celebrex or gabapentin or muscle relaxer.

## 2020-05-30 NOTE — Patient Instructions (Signed)
Good to see you Please try ice  Please let me know if you would want to try medicine if still in pain   Please send me a message in MyChart with any questions or updates.  Please see me back in 6-8 weeks.   --Dr. Raeford Razor

## 2020-06-16 ENCOUNTER — Encounter: Payer: Self-pay | Admitting: Family Medicine

## 2020-06-17 ENCOUNTER — Ambulatory Visit (INDEPENDENT_AMBULATORY_CARE_PROVIDER_SITE_OTHER): Payer: BC Managed Care – PPO | Admitting: Family Medicine

## 2020-06-17 ENCOUNTER — Encounter: Payer: Self-pay | Admitting: Family Medicine

## 2020-06-17 ENCOUNTER — Other Ambulatory Visit: Payer: Self-pay

## 2020-06-17 ENCOUNTER — Ambulatory Visit: Payer: Self-pay

## 2020-06-17 VITALS — BP 151/89 | HR 80 | Ht 67.0 in | Wt 150.0 lb

## 2020-06-17 DIAGNOSIS — M1612 Unilateral primary osteoarthritis, left hip: Secondary | ICD-10-CM

## 2020-06-17 MED ORDER — CELECOXIB 200 MG PO CAPS: ORAL_CAPSULE | ORAL | 1 refills | Status: AC

## 2020-06-17 MED ORDER — TRIAMCINOLONE ACETONIDE 40 MG/ML IJ SUSP
40.0000 mg | Freq: Once | INTRAMUSCULAR | Status: AC
  Administered 2020-06-17: 40 mg via INTRA_ARTICULAR

## 2020-06-17 NOTE — Patient Instructions (Signed)
Good to see you Please try ice   Please send me a message in MyChart with any questions or updates.  Please see me back in 2-3 months.   --Dr. Raeford Razor

## 2020-06-17 NOTE — Assessment & Plan Note (Signed)
Acute worsening of pain.  We have tried PRP saline injection with limited improvement.  Last steroid injection was January. -Injection. -Could consider a hyaluronic acid injection or Zilretta.

## 2020-06-17 NOTE — Progress Notes (Signed)
Autumn Fuller - 44 y.o. female MRN [id number]  Date of birth: 26-Nov-1975  SUBJECTIVE:  Including CC & ROS.  No chief complaint on file.   Autumn Fuller is a 44 y.o. female that is presenting for left hip injection.  Has had little improvement with PRP and saline injection thus far.   Review of Systems See HPI   HISTORY: Past Medical, Surgical, Social, and Family History Reviewed & Updated per EMR.   Pertinent Historical Findings include:  Past Medical History:  Diagnosis Date  . Anxiety   . HSV (herpes simplex virus) infection    last outbreak 2003  . 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 Postpartum care following vaginal delivery (04/11/13) 04/12/2013  . SVD (spontaneous vaginal delivery) 04/12/2013    Past Surgical History:  Procedure Laterality Date  . MOUTH SURGERY      No family history on file.  Social History   Socioeconomic History  . Marital status: Married    Spouse name: Not on file  . Number of children: Not on file  . Years of education: Not on file  . Highest education level: Not on file  Occupational History  . Not on file  Tobacco Use  . Smoking status: Never Smoker  . Smokeless tobacco: Never Used  Substance and Sexual Activity  . Alcohol use: No  . Drug use: No  . Sexual activity: Not on file  Other Topics Concern  . Not on file  Social History Narrative  . Not on file   Social Determinants of Health   Financial Resource Strain:   . Difficulty of Paying Living Expenses:   Food Insecurity:   . Worried About Charity fundraiser in the Last Year:   . Arboriculturist in the Last Year:   Transportation Needs:   . Film/video editor (Medical):   Marland Kitchen Lack of Transportation (Non-Medical):   Physical Activity:   . Days of Exercise per Week:   . Minutes of Exercise per Session:   Stress:   . Feeling of Stress :   Social Connections:   . Frequency of Communication with Friends and Family:   . Frequency of Social Gatherings with  Friends and Family:   . Attends Religious Services:   . Active Member of Clubs or Organizations:   . Attends Archivist Meetings:   Marland Kitchen Marital Status:   Intimate Partner Violence:   . Fear of Current or Ex-Partner:   . Emotionally Abused:   Marland Kitchen Physically Abused:   . Sexually Abused:      PHYSICAL EXAM:  VS: There were no vitals taken for this visit. Physical Exam Gen: NAD, alert, cooperative with exam, well-appearing   Aspiration/Injection Procedure Note PAGE PUCCIARELLI 1975/12/12  Procedure: Injection Indications: Left hip pain  Procedure Details Consent: Risks of procedure as well as the alternatives and risks of each were explained to the (patient/caregiver).  Consent for procedure obtained. Time Out: Verified patient identification, verified procedure, site/side was marked, verified correct patient position, special equipment/implants available, medications/allergies/relevent history reviewed, required imaging and test results available.  Performed.  The area was cleaned with iodine and alcohol swabs.    The left hip joint was injected using 3 cc of 1% lidocaine on a 22-gauge 3-1/2 inch needle.  A mixture containing 1 cc's of 40 mg Kenalog and 4 cc's of 0.25% bupivacaine was injected.  Ultrasound was used. Images were obtained in long views showing the injection.  A sterile dressing was applied.  Patient did tolerate procedure well.    ASSESSMENT & PLAN:   OA (osteoarthritis) of hip Acute worsening of pain.  We have tried PRP saline injection with limited improvement.  Last steroid injection was January. -Injection. -Could consider a hyaluronic acid injection or Zilretta.

## 2020-09-15 ENCOUNTER — Telehealth: Payer: Self-pay | Admitting: Family Medicine

## 2020-09-15 NOTE — Telephone Encounter (Signed)
Pt sent a mychart message needing to cancele 11/16 appt & get rescheduled for a later in Nov date-- called pt back left msg for her to call office for rescheduling.  -glh

## 2020-09-16 ENCOUNTER — Ambulatory Visit: Payer: BC Managed Care – PPO | Admitting: Family Medicine

## 2020-11-04 ENCOUNTER — Ambulatory Visit (INDEPENDENT_AMBULATORY_CARE_PROVIDER_SITE_OTHER): Payer: BC Managed Care – PPO | Admitting: Family Medicine

## 2020-11-04 ENCOUNTER — Other Ambulatory Visit: Payer: Self-pay

## 2020-11-04 DIAGNOSIS — M1612 Unilateral primary osteoarthritis, left hip: Secondary | ICD-10-CM

## 2020-11-04 NOTE — Assessment & Plan Note (Signed)
Acute on chronic in nature. Has tried steroid injections, saline injection and PRP with little improvement.  Has been in physical therapy.  Received little improvement from previous steroid injection in August. -Counseled on home exercise therapy and supportive care. -Could consider referral to Ortho. -Can try Zilretta injection.

## 2020-11-04 NOTE — Progress Notes (Signed)
  Autumn Fuller - 45 y.o. female MRN [id number]  Date of birth: 1975/12/15  SUBJECTIVE:  Including CC & ROS.  No chief complaint on file.   Autumn Fuller is a 45 y.o. female that is presenting with worsening of her left hip pain. She has tried different injections with limited relief. Has been trying physical therapy.    Review of Systems See HPI   HISTORY: Past Medical, Surgical, Social, and Family History Reviewed & Updated per EMR.   Pertinent Historical Findings include:  Past Medical History:  Diagnosis Date  . Anxiety   . HSV (herpes simplex virus) infection    last outbreak 2003  . Leukemia ([facility]) 1981   Remission in 1984  . Postpartum care following vaginal delivery (04/11/13) 04/12/2013  . SVD (spontaneous vaginal delivery) 04/12/2013    Past Surgical History:  Procedure Laterality Date  . MOUTH SURGERY      No family history on file.  Social History   Socioeconomic History  . Marital status: Married    Spouse name: Not on file  . Number of children: Not on file  . Years of education: Not on file  . Highest education level: Not on file  Occupational History  . Not on file  Tobacco Use  . Smoking status: Never Smoker  . Smokeless tobacco: Never Used  Substance and Sexual Activity  . Alcohol use: No  . Drug use: No  . Sexual activity: Not on file  Other Topics Concern  . Not on file  Social History Narrative  . Not on file   Social Determinants of Health   Financial Resource Strain: Not on file  Food Insecurity: Not on file  Transportation Needs: Not on file  Physical Activity: Not on file  Stress: Not on file  Social Connections: Not on file  Intimate Partner Violence: Not on file     PHYSICAL EXAM:  VS: BP 106/72   Ht 5\' 7"  (1.702 m)   Wt 150 lb (68 kg)   BMI 23.49 kg/m  Physical Exam Gen: NAD, alert, cooperative with exam, well-appearing  ASSESSMENT & PLAN:   OA (osteoarthritis) of hip Acute on chronic in nature. Has tried  steroid injections, saline injection and PRP with little improvement.  Has been in physical therapy.  Received little improvement from previous steroid injection in August. -Counseled on home exercise therapy and supportive care. -Could consider referral to Ortho. -Can try Zilretta injection.

## 2020-11-04 NOTE — Patient Instructions (Signed)
Good to see you Let me know when you want a referral   Please send me a message in MyChart with any questions or updates.  We'll be in touch about the injection and what the next step is.   --Dr. Jordan Likes

## 2020-11-13 ENCOUNTER — Other Ambulatory Visit: Payer: Self-pay | Admitting: *Deleted

## 2020-11-13 MED ORDER — ZILRETTA 32 MG IX SRER: 1.0000 | Freq: Once | INTRA_ARTICULAR | 0 refills | Status: AC

## 2020-11-24 ENCOUNTER — Telehealth: Payer: Self-pay | Admitting: Family Medicine

## 2020-11-24 NOTE — Telephone Encounter (Signed)
Message left on office VMB frm [facility](pre-cert co for pt's Ins co-- Maudie Mercury says they need our office fax# & the ICD pin code for Rx (/)  --Called them back gave fax number but advised once fax form rcvd medical assist or provider would supply addt'l request info.  [facility] ph# [phone]  --forwarding message to both.  -glh

## 2020-12-16 ENCOUNTER — Encounter: Payer: Self-pay | Admitting: Family Medicine

## 2021-01-05 ENCOUNTER — Ambulatory Visit: Payer: BC Managed Care – PPO | Admitting: Family Medicine

## 2021-01-05 ENCOUNTER — Ambulatory Visit: Payer: Self-pay

## 2021-01-05 ENCOUNTER — Other Ambulatory Visit: Payer: Self-pay

## 2021-01-05 ENCOUNTER — Encounter: Payer: Self-pay | Admitting: Family Medicine

## 2021-01-05 VITALS — BP 120/80 | Ht 67.0 in | Wt 150.0 lb

## 2021-01-05 DIAGNOSIS — M1612 Unilateral primary osteoarthritis, left hip: Secondary | ICD-10-CM

## 2021-01-05 MED ORDER — TRIAMCINOLONE ACETONIDE 40 MG/ML IJ SUSP
40.0000 mg | Freq: Once | INTRAMUSCULAR | Status: AC
  Administered 2021-01-05: 40 mg via INTRA_ARTICULAR

## 2021-01-05 NOTE — Addendum Note (Signed)
Addended by: Cresenciano Lick on: 01/05/2021 05:18 PM   Modules accepted: Orders

## 2021-01-05 NOTE — Patient Instructions (Signed)
Good to see you Please try ice as needed   Please send me a message in MyChart with any questions or updates.  Please keep [place] updated with your status.   --Dr. Raeford Razor

## 2021-01-05 NOTE — Progress Notes (Signed)
  Autumn Fuller - 45 y.o. female MRN [id number]  Date of birth: 02/07/1976  SUBJECTIVE:  Including CC & ROS.  No chief complaint on file.   Autumn Fuller is a 45 y.o. female that is presenting with ongoing left hip pain.  Here for an injection.   Review of Systems See HPI   HISTORY: Past Medical, Surgical, Social, and Family History Reviewed & Updated per EMR.   Pertinent Historical Findings include:  Past Medical History:  Diagnosis Date  . Anxiety   . HSV (herpes simplex virus) infection    last outbreak 2003  . 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 Postpartum care following vaginal delivery (04/11/13) 04/12/2013  . SVD (spontaneous vaginal delivery) 04/12/2013    Past Surgical History:  Procedure Laterality Date  . MOUTH SURGERY      No family history on file.  Social History   Socioeconomic History  . Marital status: Married    Spouse name: Not on file  . Number of children: Not on file  . Years of education: Not on file  . Highest education level: Not on file  Occupational History  . Not on file  Tobacco Use  . Smoking status: Never Smoker  . Smokeless tobacco: Never Used  Substance and Sexual Activity  . Alcohol use: No  . Drug use: No  . Sexual activity: Not on file  Other Topics Concern  . Not on file  Social History Narrative  . Not on file   Social Determinants of Health   Financial Resource Strain: Not on file  Food Insecurity: Not on file  Transportation Needs: Not on file  Physical Activity: Not on file  Stress: Not on file  Social Connections: Not on file  Intimate Partner Violence: Not on file     PHYSICAL EXAM:  VS: There were no vitals taken for this visit. Physical Exam Gen: NAD, alert, cooperative with exam, well-appearing    Aspiration/Injection Procedure Note Autumn Fuller 1976/10/08  Procedure: Injection Indications: Left hip pain  Procedure Details Consent: Risks of procedure as well as the alternatives  and risks of each were explained to the (patient/caregiver).  Consent for procedure obtained. Time Out: Verified patient identification, verified procedure, site/side was marked, verified correct patient position, special equipment/implants available, medications/allergies/relevent history reviewed, required imaging and test results available.  Performed.  The area was cleaned with iodine and alcohol swabs.    The left hip joint was injected using 3 cc of 1% lidocaine on a 22-gauge 3-1/2 inch needle.  A mixture containing 1 cc's of 40 mg Kenalog and 4 cc's of 0.25% bupivacaine was injected.  Ultrasound was used. Images were obtained in long views showing the injection.     A sterile dressing was applied.  Patient did tolerate procedure well.     ASSESSMENT & PLAN:   No problem-specific Assessment & Plan notes found for this encounter.

## 2021-04-08 ENCOUNTER — Encounter: Payer: Self-pay | Admitting: Family Medicine

## 2021-04-08 DIAGNOSIS — M1612 Unilateral primary osteoarthritis, left hip: Secondary | ICD-10-CM

## 2021-04-13 MED ORDER — HYDROCODONE-ACETAMINOPHEN 5-325 MG PO TABS: 1.0000 | ORAL_TABLET | Freq: Three times a day (TID) | ORAL | 0 refills | Status: AC | PRN

## 2021-04-13 NOTE — Telephone Encounter (Signed)
Provided norco for pain.   Rosemarie Ax, MD [facility] 04/13/2021, 1:08 PM

## 2021-04-23 ENCOUNTER — Encounter: Payer: Self-pay | Admitting: Family Medicine

## 2021-04-23 ENCOUNTER — Ambulatory Visit: Payer: BC Managed Care – PPO | Admitting: Family Medicine

## 2021-04-23 ENCOUNTER — Other Ambulatory Visit: Payer: Self-pay

## 2021-04-23 VITALS — BP 110/60 | Ht 67.0 in | Wt 150.0 lb

## 2021-04-23 DIAGNOSIS — M1612 Unilateral primary osteoarthritis, left hip: Secondary | ICD-10-CM | POA: Diagnosis not present

## 2021-04-23 MED ORDER — METHYLPREDNISOLONE ACETATE 80 MG/ML IJ SUSP
80.0000 mg | Freq: Once | INTRAMUSCULAR | Status: AC
  Administered 2021-04-23: 80 mg via INTRA_ARTICULAR

## 2021-04-23 NOTE — Progress Notes (Signed)
   Office Visit Note   Patient: Autumn Fuller           Date of Birth: 1976-10-24           MRN: [id number] Visit Date: 04/23/2021 Requested by: Kathyrn Lass, [place],  [place] [address] PCP: Kathyrn Lass, MD  Subjective: CC: Left hip pain  HPI: 45 year old female presenting to clinic today for a left hip steroid injection.  Patient has a known history of significant osteoarthritis in the left hip, and has undergone extensive conservative treatment thus far-including physical therapy, PRP, and previous steroid injections.  She states that the last steroid injection offered approximately 3 months of pain relief, which is the best she is seen thus far.  When severe, her pain is within the left groin as well as the left anterior thigh and occasionally radiating into the left knee.  She is concerned that she will need a hip replacement in the future, but would like to delay this is much as possible.  She is otherwise doing well, with no additional concerns today.               Objective: Vital Signs: BP 110/60   Ht 5\' 7"  (1.702 m)   Wt 150 lb (68 kg)   BMI 23.49 kg/m  No flowsheet data found.   No flowsheet data found.  Physical Exam:  General:  Alert and oriented, in no acute distress. Pulm:  Breathing unlabored. Psy:  Normal mood, congruent affect. Skin: Left inguinal area without bruises, rashes, or erythema. Overlying skin intact.  Normal gait. Left hip demonstrates reduced internal rotation when compared to the right.  There is significant groin pain with attempts at internal hip rotation.  Right leg hip range of motion is nonpainful.  External rotation is symmetric. 5 out of 5 strength with hip flexion, hip abduction, and adduction; as well as knee flexion and extension.  Imaging/procedures: Ultrasound guided left hip cortisone Injection:  Risks and benefits of procedure discussed, Patient opted to proceed.  Written consent obtained.  Timeout performed.   Skin prepped in a sterile fashion with alcohol. Ethyl Chloride was used for topical analgesia.  Left hip was injected with 4cc 1% Lidocaine without epinephrine combined with 80 mg methylprednisolone using a 25G, 3in needle, under ultrasound guidance.  Injectate was seen filling the joint capsule.    Patient tolerated the injection well with no immediate complications. Aftercare instructions were discussed, and patient was given strict return precautions.    Assessment & Plan: 45 year old female presenting to clinic with exacerbation of chronic left hip pain.  Previously responded well to cortisone injection, and repeat injection was performed today under ultrasound guidance.  Patient tolerated this procedure well, and endorsed immediate symptomatic improvement during anesthetic phase. -Aftercare and return precautions were discussed. -Continue previously prescribed rehabilitation exercises. -Should her pain no longer be controlled with corticosteroid injections, would consider referral to hip surgeon to discuss neck steps in care.  Optimistic this can be postponed as long as possible with injection therapy. -Patient expressed understanding with plan, she had no further concerns today.

## 2021-04-23 NOTE — Patient Instructions (Signed)
You had an injection today. Things to be aware of after injection are listed below:   . You may experience no significant improvement or even a slight worsening in your symptoms during the first 24 to 48 hours. After that we expect your symptoms to improve gradually over the next 2 weeks for the medicine to have its maximal effect. You should continue to have improvement out to 6 weeks after your injection. . We recommend icing the site of the injection for 20 minutes 1-2 times the day of your injection and as needed for pain over the following several days. . You may shower but no swimming, tub bath or Jacuzzi for 24 hours. . If your bandage falls off this does not need to be replaced. It is appropriate to remove the bandage after 4 hours. . You may resume light activities as tolerated.  It can take several weeks to see improvements following injection. If after 2 weeks you are continuing to have worsening symptoms, please call our office to discuss what the next appropriate actions should be including the potential for a return office visit or other diagnostic testing.  POSSIBLE PROCEDURE SIDE EFFECTS: The side effects of the injection are usually minimal, self-limited, and usually will resolve on their own. Common side effects that can occur over the first several days following injection include:  . Increased numbness or tingling . Worsening pain, stiffness, or slight weakness . Swelling or bruising at the injection site  If you are concerned, please feel free to contact the office with questions.   Please call our office immediately if you experience any of the following symptoms over the next 2 weeks as these can be signs of infection:  . Fever greater than 100.5F . Significant swelling at the injection site . Significant redness or drainage from the injection site  

## 2021-07-15 ENCOUNTER — Telehealth: Payer: Self-pay | Admitting: Family Medicine

## 2021-07-15 DIAGNOSIS — M1612 Unilateral primary osteoarthritis, left hip: Secondary | ICD-10-CM

## 2021-07-15 NOTE — Telephone Encounter (Signed)
Patient called left msg requesting Referral to [facility] /Dr. Alvan Dame for possible Hip surgical evaluation-  ( Pt says tried to message Dr.Schmitz via Mychart but Epic woun't allow-  - Pt says open to OV if one is required by provider for referral.   Forwarding request to Rebecca

## 2021-07-20 ENCOUNTER — Ambulatory Visit: Payer: BC Managed Care – PPO | Admitting: Family Medicine

## 2023-02-14 ENCOUNTER — Encounter: Payer: Self-pay | Admitting: *Deleted

## 2023-04-05 ENCOUNTER — Other Ambulatory Visit (HOSPITAL_COMMUNITY): Payer: Self-pay
